# Patient Record
Sex: Male | Born: 1995
Health system: Southern US, Community
[De-identification: ages and names within clinical notes are randomized; demographics above are authoritative.]

## PROBLEM LIST (undated history)

## (undated) DIAGNOSIS — J3089 Other allergic rhinitis: Secondary | ICD-10-CM

## (undated) DIAGNOSIS — L309 Dermatitis, unspecified: Secondary | ICD-10-CM

## (undated) DIAGNOSIS — S62309A Unspecified fracture of unspecified metacarpal bone, initial encounter for closed fracture: Secondary | ICD-10-CM

## (undated) DIAGNOSIS — Z973 Presence of spectacles and contact lenses: Secondary | ICD-10-CM

## (undated) HISTORY — PX: NO PAST SURGERIES: SHX2092

---
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
## 2015-01-21 ENCOUNTER — Encounter (HOSPITAL_BASED_OUTPATIENT_CLINIC_OR_DEPARTMENT_OTHER): Payer: Self-pay | Admitting: *Deleted

## 2015-01-21 ENCOUNTER — Emergency Department (HOSPITAL_BASED_OUTPATIENT_CLINIC_OR_DEPARTMENT_OTHER)
Admission: EM | Admit: 2015-01-21 | Discharge: 2015-01-21 | Disposition: A | Discharge status: Home / Self Care | Payer: Medicaid Other | Attending: Emergency Medicine | Admitting: Emergency Medicine

## 2015-01-21 ENCOUNTER — Emergency Department (HOSPITAL_BASED_OUTPATIENT_CLINIC_OR_DEPARTMENT_OTHER): Payer: Medicaid Other

## 2015-01-21 DIAGNOSIS — Y998 Other external cause status: Secondary | ICD-10-CM | POA: Insufficient documentation

## 2015-01-21 DIAGNOSIS — Z7951 Long term (current) use of inhaled steroids: Secondary | ICD-10-CM | POA: Insufficient documentation

## 2015-01-21 DIAGNOSIS — Y9289 Other specified places as the place of occurrence of the external cause: Secondary | ICD-10-CM | POA: Diagnosis not present

## 2015-01-21 DIAGNOSIS — J45909 Unspecified asthma, uncomplicated: Secondary | ICD-10-CM | POA: Diagnosis not present

## 2015-01-21 DIAGNOSIS — S62306A Unspecified fracture of fifth metacarpal bone, right hand, initial encounter for closed fracture: Secondary | ICD-10-CM

## 2015-01-21 DIAGNOSIS — S62314A Displaced fracture of base of fourth metacarpal bone, right hand, initial encounter for closed fracture: Secondary | ICD-10-CM | POA: Insufficient documentation

## 2015-01-21 DIAGNOSIS — Y9389 Activity, other specified: Secondary | ICD-10-CM | POA: Diagnosis not present

## 2015-01-21 DIAGNOSIS — H539 Unspecified visual disturbance: Secondary | ICD-10-CM | POA: Diagnosis not present

## 2015-01-21 DIAGNOSIS — W228XXA Striking against or struck by other objects, initial encounter: Secondary | ICD-10-CM | POA: Insufficient documentation

## 2015-01-21 DIAGNOSIS — Z8669 Personal history of other diseases of the nervous system and sense organs: Secondary | ICD-10-CM | POA: Insufficient documentation

## 2015-01-21 DIAGNOSIS — S62308A Unspecified fracture of other metacarpal bone, initial encounter for closed fracture: Secondary | ICD-10-CM

## 2015-01-21 DIAGNOSIS — S62336A Displaced fracture of neck of fifth metacarpal bone, right hand, initial encounter for closed fracture: Secondary | ICD-10-CM | POA: Insufficient documentation

## 2015-01-21 DIAGNOSIS — S6991XA Unspecified injury of right wrist, hand and finger(s), initial encounter: Secondary | ICD-10-CM | POA: Diagnosis present

## 2015-01-21 MED ORDER — HYDROCODONE-ACETAMINOPHEN 5-325 MG PO TABS: 2.0000 | ORAL_TABLET | ORAL | Status: DC | PRN

## 2015-01-21 NOTE — ED Provider Notes (Signed)
CSN: [id number]     Arrival date & time 01/21/15  1405 History   First MD Initiated Contact with Patient 01/21/15 1412     Chief Complaint  Patient presents with  . Hand Injury     (Consider location/radiation/quality/duration/timing/severity/associated sxs/prior Treatment) HPI Comments: Presents to the ER for evaluation of right hand injury. Patient reportedly became angry and punched a wall 2 hours before arrival. Patient complaints moderate to severe pain and swelling of the right hand. Pain worsens with movement. No wrist injury.  Patient is a 19 y.o. male presenting with hand injury.  Hand Injury   Past Medical History  Diagnosis Date  . Allergy   . Asthma   . Vision abnormalities    History reviewed. No pertinent past surgical history. Family History  Problem Relation Age of Onset  . Cancer Brother   . Hypertension Brother   . Hypertension Father   . Hypertension Maternal Aunt   . Hypertension Maternal Grandmother   . Hypertension Maternal Grandfather   . Hypertension Paternal Grandmother   . Hypertension Paternal Grandfather    History  Substance Use Topics  . Smoking status: Never Smoker   . Smokeless tobacco: Never Used  . Alcohol Use: No    Review of Systems  Musculoskeletal: Positive for arthralgias.  Skin: Negative.   Neurological: Negative.       Allergies  Review of patient's allergies indicates no known allergies.  Home Medications   Prior to Admission medications   Medication Sig Start Date End Date Taking? Authorizing Provider  cetirizine (ZYRTEC) 10 MG tablet Take 10 mg by mouth daily as needed.    Historical Provider, MD  fluticasone (FLONASE) 50 MCG/ACT nasal spray Place 2 sprays into the nose daily. 06/24/11 06/23/12  Lucio EdwardShilpa Gosrani, MD  mometasone (ELOCON) 0.1 % cream Apply sparingly twice a day to eczema rash for a week 11/13/12   Faylene Kurtzeborah Leiner, MD   BP 118/54 mmHg  Pulse 64  Temp(Src) 98.7 F (37.1 C) (Oral)  Resp 16  Ht 5\' 9"   (1.753 m)  Wt 125 lb (56.7 kg)  BMI 18.45 kg/m2  SpO2 100% Physical Exam  Musculoskeletal:       Right wrist: Normal.       Right hand: He exhibits decreased range of motion, tenderness, bony tenderness (fifth metacarpal) and deformity. He exhibits normal capillary refill. Normal sensation noted. Normal strength noted.  Neurological: He is alert. He has normal strength. No cranial nerve deficit or sensory deficit. GCS eye subscore is 4. GCS verbal subscore is 5. GCS motor subscore is 6.  Skin: Skin is warm, dry and intact. No abrasion, no bruising and no laceration noted.    ED Course  Procedures (including critical care time) Labs Review Labs Reviewed - No data to display  Imaging Review Dg Hand Complete Right  01/21/2015   CLINICAL DATA:  The patient punched a wall. Scratch the patient punched a wall today. Right hand pain. Initial encounter.  EXAM: RIGHT HAND - COMPLETE 3+ VIEW  COMPARISON:  Plain films right hand 05/01/2013.  FINDINGS: The patient has an acute fracture of the neck of the fifth metacarpal with volar and medial angulation. There is also a fracture of the base of the fourth metacarpal which appears nondisplaced. No other acute bony or joint abnormality is identified. Soft tissue swelling in association with the patient's fractures is noted.  IMPRESSION: Acute fractures of the neck of the fifth metacarpal and base of the fourth metacarpal as described.  Electronically Signed   By: Drusilla Kannerhomas  Dalessio M.D.   On: 01/21/2015 14:31     EKG Interpretation None      MDM   Final diagnoses:  None   fracture fourth and fifth metacarpal  Resents to the ER for evaluation of hand injury. Patient punched a wall prior to arrival. Patient has swelling and tenderness of the lateral aspect of the hand. X-ray confirms fracture of fourth and fifth metacarpal. Patient placed in an ulnar gutter splint, follow-up with hand surgery.    Gilda Creasehristopher J Aerilynn Goin, MD 01/21/15 1505

## 2015-01-21 NOTE — ED Notes (Signed)
Pt reports punching a wall x 2 hrs ago

## 2015-01-21 NOTE — Discharge Instructions (Signed)
Boxer's Fracture °You have a break (fracture) of the fifth metacarpal bone. This is commonly called a boxer's fracture. This is the bone in the hand where the little finger attaches. The fracture is in the end of that bone, closest to the little finger. It is usually caused when you hit an object with a clenched fist. Often, the knuckle is pushed down by the impact. Sometimes, the fracture rotates out of position. A boxer's fracture will usually heal within 6 weeks, if it is treated properly and protected from re-injury. Surgery is sometimes needed. °A cast, splint, or bulky hand dressing may be used to protect and immobilize a boxer's fracture. Do not remove this device or dressing until your caregiver approves. Keep your hand elevated, and apply ice packs for 15-20 minutes every 2 hours, for the first 2 days. Elevation and ice help reduce swelling and relieve pain. See your caregiver, or an orthopedic specialist, for follow-up care within the next 10 days. This is to make sure your fracture is healing properly. °Document Released: 09/05/2005 Document Revised: 11/28/2011 Document Reviewed: 02/23/2007 °ExitCare® Patient Information ©2015 [employer]. This information is not intended to replace advice given to you by your health care provider. Make sure you discuss any questions you have with your health care provider. ° °

## 2015-01-27 ENCOUNTER — Encounter (HOSPITAL_BASED_OUTPATIENT_CLINIC_OR_DEPARTMENT_OTHER): Payer: Self-pay | Admitting: *Deleted

## 2015-01-27 NOTE — Progress Notes (Signed)
NPO AFTER MN WITH EXCEPTION CLEAR LIQUIDS UNTIL 0900.  ARRIVE AT 1345.  NEEDS HG.  MAY TAKE HYDROCODONE IF NEEDED AM DOS.

## 2015-01-29 ENCOUNTER — Ambulatory Visit (HOSPITAL_BASED_OUTPATIENT_CLINIC_OR_DEPARTMENT_OTHER): Payer: Medicaid Other | Admitting: Anesthesiology

## 2015-01-29 ENCOUNTER — Encounter (HOSPITAL_BASED_OUTPATIENT_CLINIC_OR_DEPARTMENT_OTHER): Payer: Self-pay | Admitting: *Deleted

## 2015-01-29 ENCOUNTER — Ambulatory Visit (HOSPITAL_BASED_OUTPATIENT_CLINIC_OR_DEPARTMENT_OTHER)
Admission: RE | Admit: 2015-01-29 | Discharge: 2015-01-29 | Disposition: A | Discharge status: Home / Self Care | Payer: Medicaid Other | Source: Ambulatory Visit | Attending: Orthopedic Surgery | Admitting: Orthopedic Surgery

## 2015-01-29 ENCOUNTER — Encounter (HOSPITAL_BASED_OUTPATIENT_CLINIC_OR_DEPARTMENT_OTHER)
Admission: RE | Disposition: A | Discharge status: Home / Self Care | Payer: Self-pay | Source: Ambulatory Visit | Attending: Orthopedic Surgery

## 2015-01-29 DIAGNOSIS — Z7952 Long term (current) use of systemic steroids: Secondary | ICD-10-CM | POA: Insufficient documentation

## 2015-01-29 DIAGNOSIS — S62324A Displaced fracture of shaft of fourth metacarpal bone, right hand, initial encounter for closed fracture: Secondary | ICD-10-CM | POA: Diagnosis present

## 2015-01-29 DIAGNOSIS — J45909 Unspecified asthma, uncomplicated: Secondary | ICD-10-CM | POA: Insufficient documentation

## 2015-01-29 DIAGNOSIS — Z7951 Long term (current) use of inhaled steroids: Secondary | ICD-10-CM | POA: Insufficient documentation

## 2015-01-29 DIAGNOSIS — S62336A Displaced fracture of neck of fifth metacarpal bone, right hand, initial encounter for closed fracture: Secondary | ICD-10-CM | POA: Insufficient documentation

## 2015-01-29 DIAGNOSIS — J302 Other seasonal allergic rhinitis: Secondary | ICD-10-CM | POA: Diagnosis not present

## 2015-01-29 DIAGNOSIS — L309 Dermatitis, unspecified: Secondary | ICD-10-CM | POA: Diagnosis not present

## 2015-01-29 DIAGNOSIS — W2209XA Striking against other stationary object, initial encounter: Secondary | ICD-10-CM | POA: Diagnosis not present

## 2015-01-29 HISTORY — DX: Unspecified fracture of unspecified metacarpal bone, initial encounter for closed fracture: S62.309A

## 2015-01-29 HISTORY — DX: Other allergic rhinitis: J30.89

## 2015-01-29 HISTORY — DX: Presence of spectacles and contact lenses: Z97.3

## 2015-01-29 HISTORY — DX: Dermatitis, unspecified: L30.9

## 2015-01-29 HISTORY — PX: CLOSED REDUCTION FINGER WITH PERCUTANEOUS PINNING: SHX5612

## 2015-01-29 SURGERY — CLOSED REDUCTION, FINGER, WITH PERCUTANEOUS PINNING
Anesthesia: General | Site: Finger | Laterality: Right

## 2015-01-29 MED ORDER — OXYCODONE HCL 5 MG/5ML PO SOLN
5.0000 mg | Freq: Once | ORAL | Status: DC | PRN
  Filled 2015-01-29: qty 5

## 2015-01-29 MED ORDER — LIDOCAINE HCL (CARDIAC) 20 MG/ML IV SOLN
INTRAVENOUS | Status: DC | PRN
  Administered 2015-01-29: 80 mg via INTRAVENOUS

## 2015-01-29 MED ORDER — LACTATED RINGERS IV SOLN
INTRAVENOUS | Status: DC
  Administered 2015-01-29: 15:00:00 via INTRAVENOUS
  Filled 2015-01-29: qty 1000

## 2015-01-29 MED ORDER — ONDANSETRON HCL 4 MG/2ML IJ SOLN
4.0000 mg | Freq: Four times a day (QID) | INTRAMUSCULAR | Status: DC | PRN
  Filled 2015-01-29: qty 2

## 2015-01-29 MED ORDER — HYDROCODONE-ACETAMINOPHEN 5-325 MG PO TABS
ORAL_TABLET | ORAL | Status: AC
  Filled 2015-01-29: qty 1

## 2015-01-29 MED ORDER — HYDROMORPHONE HCL 1 MG/ML IJ SOLN
INTRAMUSCULAR | Status: AC
  Filled 2015-01-29: qty 1

## 2015-01-29 MED ORDER — MIDAZOLAM HCL 2 MG/2ML IJ SOLN
INTRAMUSCULAR | Status: AC
  Filled 2015-01-29: qty 2

## 2015-01-29 MED ORDER — CEFAZOLIN SODIUM-DEXTROSE 2-3 GM-% IV SOLR
INTRAVENOUS | Status: AC
  Filled 2015-01-29: qty 50

## 2015-01-29 MED ORDER — HYDROCODONE-ACETAMINOPHEN 5-325 MG PO TABS
1.0000 | ORAL_TABLET | Freq: Four times a day (QID) | ORAL | Status: DC | PRN
  Administered 2015-01-29: 1 via ORAL
  Filled 2015-01-29: qty 1

## 2015-01-29 MED ORDER — BUPIVACAINE HCL (PF) 0.25 % IJ SOLN
INTRAMUSCULAR | Status: DC | PRN
  Administered 2015-01-29: 8 mL

## 2015-01-29 MED ORDER — CHLORHEXIDINE GLUCONATE 4 % EX LIQD
60.0000 mL | Freq: Once | CUTANEOUS | Status: AC
  Administered 2015-01-29: 4 via TOPICAL
  Filled 2015-01-29: qty 60

## 2015-01-29 MED ORDER — OXYCODONE HCL 5 MG PO TABS
5.0000 mg | ORAL_TABLET | Freq: Once | ORAL | Status: DC | PRN
  Filled 2015-01-29: qty 1

## 2015-01-29 MED ORDER — HYDROCODONE-ACETAMINOPHEN 5-325 MG PO TABS: 30.0000 | ORAL_TABLET | ORAL | Status: DC | PRN

## 2015-01-29 MED ORDER — FENTANYL CITRATE (PF) 100 MCG/2ML IJ SOLN
INTRAMUSCULAR | Status: DC | PRN
  Administered 2015-01-29: 100 ug via INTRAVENOUS

## 2015-01-29 MED ORDER — ONDANSETRON HCL 4 MG/2ML IJ SOLN
INTRAMUSCULAR | Status: DC | PRN
  Administered 2015-01-29: 4 mg via INTRAVENOUS

## 2015-01-29 MED ORDER — CEFAZOLIN SODIUM-DEXTROSE 2-3 GM-% IV SOLR
2.0000 g | INTRAVENOUS | Status: AC
  Administered 2015-01-29: 2 g via INTRAVENOUS
  Filled 2015-01-29: qty 50

## 2015-01-29 MED ORDER — PROPOFOL 10 MG/ML IV BOLUS
INTRAVENOUS | Status: DC | PRN
  Administered 2015-01-29: 200 mg via INTRAVENOUS

## 2015-01-29 MED ORDER — MIDAZOLAM HCL 5 MG/5ML IJ SOLN
INTRAMUSCULAR | Status: DC | PRN
  Administered 2015-01-29: 2 mg via INTRAVENOUS

## 2015-01-29 MED ORDER — HYDROMORPHONE HCL 1 MG/ML IJ SOLN
0.2500 mg | INTRAMUSCULAR | Status: DC | PRN
  Administered 2015-01-29 (×3): 0.25 mg via INTRAVENOUS
  Filled 2015-01-29: qty 1

## 2015-01-29 MED ORDER — FENTANYL CITRATE (PF) 100 MCG/2ML IJ SOLN
INTRAMUSCULAR | Status: AC
  Filled 2015-01-29: qty 4

## 2015-01-29 MED ORDER — DEXAMETHASONE SODIUM PHOSPHATE 4 MG/ML IJ SOLN
INTRAMUSCULAR | Status: DC | PRN
  Administered 2015-01-29: 10 mg via INTRAVENOUS

## 2015-01-29 SURGICAL SUPPLY — 58 items
BANDAGE CONFORM 3  STR LF (GAUZE/BANDAGES/DRESSINGS) IMPLANT
BANDAGE ELASTIC 3 VELCRO ST LF (GAUZE/BANDAGES/DRESSINGS) ×4 IMPLANT
BANDAGE ELASTIC 4 VELCRO ST LF (GAUZE/BANDAGES/DRESSINGS) IMPLANT
BANDAGE GAUZE ELAST BULKY 4 IN (GAUZE/BANDAGES/DRESSINGS) IMPLANT
BENZOIN TINCTURE PRP APPL 2/3 (GAUZE/BANDAGES/DRESSINGS) IMPLANT
BLADE SURG 15 STRL LF DISP TIS (BLADE) ×1 IMPLANT
BLADE SURG 15 STRL SS (BLADE) ×1
BNDG COHESIVE 3X5 TAN STRL LF (GAUZE/BANDAGES/DRESSINGS) ×4 IMPLANT
BNDG ESMARK 4X9 LF (GAUZE/BANDAGES/DRESSINGS) IMPLANT
BNDG GAUZE ELAST 4 BULKY (GAUZE/BANDAGES/DRESSINGS) ×2 IMPLANT
CLOTH BEACON ORANGE TIMEOUT ST (SAFETY) ×2 IMPLANT
CORDS BIPOLAR (ELECTRODE) IMPLANT
COVER BACK TABLE 60X90IN (DRAPES) ×2 IMPLANT
CUFF TOURNIQUET SINGLE 18IN (TOURNIQUET CUFF) ×2 IMPLANT
CUFF TOURNIQUET SINGLE 24IN (TOURNIQUET CUFF) IMPLANT
DRAIN PENROSE 18X1/4 LTX STRL (WOUND CARE) IMPLANT
DRAPE EXTREMITY T 121X128X90 (DRAPE) ×2 IMPLANT
DRAPE OEC MINIVIEW 54X84 (DRAPES) IMPLANT
DRAPE SURG 17X23 STRL (DRAPES) IMPLANT
DRSG EMULSION OIL 3X3 NADH (GAUZE/BANDAGES/DRESSINGS) IMPLANT
ELECT NEEDLE TIP 2.8 STRL (NEEDLE) IMPLANT
ELECT REM PT RETURN 9FT ADLT (ELECTROSURGICAL) ×2
ELECTRODE REM PT RTRN 9FT ADLT (ELECTROSURGICAL) ×1 IMPLANT
GAUZE SPONGE 4X4 16PLY XRAY LF (GAUZE/BANDAGES/DRESSINGS) IMPLANT
GAUZE XEROFORM 1X8 LF (GAUZE/BANDAGES/DRESSINGS) ×2 IMPLANT
GLOVE BIO SURGEON STRL SZ8 (GLOVE) ×2 IMPLANT
GOWN STRL REUS W/TWL LRG LVL3 (GOWN DISPOSABLE) ×2 IMPLANT
GOWN STRL REUS W/TWL XL LVL3 (GOWN DISPOSABLE) ×2 IMPLANT
K-WIRE .035X4 (WIRE) IMPLANT
K-WIRE .045X4 (WIRE) ×8 IMPLANT
LOOP VESSEL MAXI BLUE (MISCELLANEOUS) IMPLANT
NEEDLE HYPO 25X1 1.5 SAFETY (NEEDLE) ×2 IMPLANT
NS IRRIG 500ML POUR BTL (IV SOLUTION) ×2 IMPLANT
PACK BASIN DAY SURGERY FS (CUSTOM PROCEDURE TRAY) ×2 IMPLANT
PAD CAST 3X4 CTTN HI CHSV (CAST SUPPLIES) ×1 IMPLANT
PADDING CAST ABS 3INX4YD NS (CAST SUPPLIES) ×1
PADDING CAST ABS 4INX4YD NS (CAST SUPPLIES) ×1
PADDING CAST ABS COTTON 3X4 (CAST SUPPLIES) ×1 IMPLANT
PADDING CAST ABS COTTON 4X4 ST (CAST SUPPLIES) ×1 IMPLANT
PADDING CAST COTTON 3X4 STRL (CAST SUPPLIES) ×1
PENCIL BUTTON HOLSTER BLD 10FT (ELECTRODE) IMPLANT
SPLINT PLASTER CAST XFAST 3X15 (CAST SUPPLIES) ×1 IMPLANT
SPLINT PLASTER CAST XFAST 4X15 (CAST SUPPLIES) IMPLANT
SPLINT PLASTER XTRA FAST SET 4 (CAST SUPPLIES)
SPLINT PLASTER XTRA FASTSET 3X (CAST SUPPLIES) ×1
SPONGE GAUZE 4X4 12PLY (GAUZE/BANDAGES/DRESSINGS) ×2 IMPLANT
SPONGE GAUZE 4X4 12PLY STER LF (GAUZE/BANDAGES/DRESSINGS) ×2 IMPLANT
STOCKINETTE 4X48 STRL (DRAPES) ×2 IMPLANT
STRIP CLOSURE SKIN 1/2X4 (GAUZE/BANDAGES/DRESSINGS) IMPLANT
SUT ETHILON 4 0 P 3 18 (SUTURE) IMPLANT
SUT ETHILON 5 0 P 3 18 (SUTURE)
SUT NYLON ETHILON 5-0 P-3 1X18 (SUTURE) IMPLANT
SYR BULB 3OZ (MISCELLANEOUS) ×2 IMPLANT
SYR CONTROL 10ML LL (SYRINGE) ×2 IMPLANT
TOWEL OR 17X24 6PK STRL BLUE (TOWEL DISPOSABLE) ×4 IMPLANT
UNDERPAD 30X30 INCONTINENT (UNDERPADS AND DIAPERS) ×2 IMPLANT
WATER STERILE IRR 500ML POUR (IV SOLUTION) ×2 IMPLANT
k-wires ×8 IMPLANT

## 2015-01-29 NOTE — Anesthesia Procedure Notes (Signed)
Procedure Name: LMA Insertion Date/Time: 01/29/2015 3:58 PM Performed by: Tyrone NineSAUVE, Laureen Frederic F Pre-anesthesia Checklist: Patient identified, Timeout performed, Emergency Drugs available, Suction available and Patient being monitored Patient Re-evaluated:Patient Re-evaluated prior to inductionOxygen Delivery Method: Circle system utilized Preoxygenation: Pre-oxygenation with 100% oxygen Intubation Type: IV induction Ventilation: Mask ventilation without difficulty LMA: LMA inserted LMA Size: 4.0 Number of attempts: 1 Airway Equipment and Method: Bite block Placement Confirmation: breath sounds checked- equal and bilateral and positive ETCO2 Tube secured with: Tape Dental Injury: Teeth and Oropharynx as per pre-operative assessment

## 2015-01-29 NOTE — H&P (Signed)
Shawn Dillon is an 19 y.o. male.   Chief Complaint: Right hand injury small and ring fingers HPI: Pt hit stationary object Closed injury to right hand Pt here for surgery for multiple metacarpal fractures No prior surgery to right hand  Past Medical History  Diagnosis Date  . Eczema   . Fracture, metacarpal     RIGHT 4TH AND 5TH FINGER   . Environmental and seasonal allergies   . Wears contact lenses     Past Surgical History  Procedure Laterality Date  . No past surgeries      Family History  Problem Relation Age of Onset  . Cancer Brother   . Hypertension Brother   . Hypertension Father   . Hypertension Maternal Aunt   . Hypertension Maternal Grandmother   . Hypertension Maternal Grandfather   . Hypertension Paternal Grandmother   . Hypertension Paternal Grandfather    Social History:  reports that he has never smoked. He has never used smokeless tobacco. He reports that he does not drink alcohol or use illicit drugs.  Allergies: No Known Allergies  Medications Prior to Admission  Medication Sig Dispense Refill  . cetirizine (ZYRTEC) 10 MG tablet Take 10 mg by mouth daily as needed.    . fluticasone (FLONASE) 50 MCG/ACT nasal spray Place into both nostrils as needed for allergies or rhinitis.    Marland Kitchen. HYDROcodone-acetaminophen (NORCO/VICODIN) 5-325 MG per tablet Take 2 tablets by mouth every 4 (four) hours as needed for moderate pain. 20 tablet 0  . mometasone (ELOCON) 0.1 % cream Apply sparingly twice a day to eczema rash for a week 30 g 1    No results found for this or any previous visit (from the past 48 hour(s)). No results found.  ROSNO RECENT ILLNESSES OR HOSPITALIZATIONS  Blood pressure 130/51, pulse 58, temperature 97.8 F (36.6 C), temperature source Oral, resp. rate 12, weight 54.885 kg (121 lb), SpO2 100 %. Physical Exam  General Appearance:  Alert, cooperative, no distress, appears stated age  Head:  Normocephalic, without obvious abnormality, atraumatic   Eyes:  Pupils equal, conjunctiva/corneas clear,         Throat: Lips, mucosa, and tongue normal; teeth and gums normal  Neck: No visible masses     Lungs:   respirations unlabored  Chest Wall:  No tenderness or deformity  Heart:  Regular rate and rhythm,  Abdomen:   Soft, non-tender,         Extremities: RIGHT HAND SKIN INTACT WIGGLES FINGERS FINGERS WARM WELL PERFUSED GOOD MUSCLE TONE ABLE TO CROSS FINGERS  Pulses: 2+ and symmetric  Skin: Skin color, texture, turgor normal, no rashes or lesions     Neurologic: Normal    Assessment/Plan RIGHT SMALL AND RING FINGER METACARPAL FRACTURES  RIGHT HAND CLOSED REDUCTION AND PINNING POSSIBLE OPEN REDUCTION AND INTERNAL FIXATION  R/B/A DISCUSSED WITH PT IN OFFICE.  PT VOICED UNDERSTANDING OF PLAN CONSENT SIGNED DAY OF SURGERY PT SEEN AND EXAMINED PRIOR TO OPERATIVE PROCEDURE/DAY OF SURGERY SITE MARKED. QUESTIONS ANSWERED WILL GO HOME FOLLOWING SURGERY WE ARE PLANNING SURGERY FOR YOUR UPPER EXTREMITY. THE RISKS AND BENEFITS OF SURGERY INCLUDE BUT NOT LIMITED TO BLEEDING INFECTION, DAMAGE TO NEARBY NERVES ARTERIES TENDONS, FAILURE OF SURGERY TO ACCOMPLISH ITS INTENDED GOALS, PERSISTENT SYMPTOMS AND NEED FOR FURTHER SURGICAL INTERVENTION. WITH THIS IN MIND WE WILL PROCEED. I HAVE DISCUSSED WITH THE PATIENT THE PRE AND POSTOPERATIVE REGIMEN AND THE DOS AND DON'TS. PT VOICED UNDERSTANDING AND INFORMED CONSENT SIGNED.  Sharma CovertTMANN,Alizay Bronkema W 01/29/2015,  3:54 PM

## 2015-01-29 NOTE — Discharge Instructions (Signed)
KEEP BANDAGE CLEAN AND DRY °CALL OFFICE FOR F/U APPT [phone] IN 2 WEEKS °KEEP HAND ELEVATED ABOVE HEART °OK TO APPLY ICE TO OPERATIVE AREA °CONTACT OFFICE IF ANY WORSENING PAIN OR CONCERNS. ° ° °      HAND SURGERY ° °  HOME CARE INSTRUCTIONS ° ° ° °The following instructions have been prepared to help you care for yourself upon your return home today. ° °Wound Care:  °Keep your hand elevated above the level of your heart. Do not allow it to dangle by your side. Keep the dressing dry and do not remove it unless your doctor advises you to do so. He will usually change it at the time of you post-op visit. Moving your fingers is advised to stimulate circulation but will depend on the site of your surgery. Of course, if you have a splint applied your doctor will advise you about movement. ° °Activity:  °Do not drive or operate machinery today. Rest today and then you may return to your normal activity and work as indicated by your physician. ° °Diet: °Drink liquids today or eat a light diet. You may resume a regular diet tomorrow. ° °General expectations: °Pain for two or three days. °Fingers may become slightly swollen.  ° °Unexpected Observations- Call your doctor if any of these occur: °Severe pain not relieved by pain medication. °Elevated temperature. °Dressing soaked with blood. °Inability to move fingers. °White or bluish color to fingers. ° ° ° ° ° °Post Anesthesia Home Care Instructions ° °Activity: °Get plenty of rest for the remainder of the day. A responsible adult should stay with you for 24 hours following the procedure.  °For the next 24 hours, DO NOT: °-Drive a car °-Operate machinery °-Drink alcoholic beverages °-Take any medication unless instructed by your physician °-Make any legal decisions or sign important papers. ° °Meals: °Start with liquid foods such as gelatin or soup. Progress to regular foods as tolerated. Avoid greasy, spicy, heavy foods. If nausea and/or vomiting occur, drink only clear  liquids until the nausea and/or vomiting subsides. Call your physician if vomiting continues. ° °Special Instructions/Symptoms: °Your throat may feel dry or sore from the anesthesia or the breathing tube placed in your throat during surgery. If this causes discomfort, gargle with warm salt water. The discomfort should disappear within 24 hours. ° °If you had a scopolamine patch placed behind your ear for the management of post- operative nausea and/or vomiting: ° °1. The medication in the patch is effective for 72 hours, after which it should be removed.  Wrap patch in a tissue and discard in the trash. Wash hands thoroughly with soap and water. °2. You may remove the patch earlier than 72 hours if you experience unpleasant side effects which may include dry mouth, dizziness or visual disturbances. °3. Avoid touching the patch. Wash your hands with soap and water after contact with the patch. °  ° °

## 2015-01-29 NOTE — Anesthesia Preprocedure Evaluation (Addendum)
Anesthesia Evaluation  Patient identified by MRN, date of birth, ID band Patient awake    Reviewed: Allergy & Precautions, NPO status , Patient's Chart, lab work & pertinent test results  Airway Mallampati: I  TM Distance: >3 FB Neck ROM: full    Dental  (+) Teeth Intact, Dental Advisory Given   Pulmonary asthma ,  breath sounds clear to auscultation        Cardiovascular Exercise Tolerance: Good negative cardio ROS  Rhythm:regular Rate:Normal     Neuro/Psych negative neurological ROS     GI/Hepatic negative GI ROS, Neg liver ROS,   Endo/Other  negative endocrine ROS  Renal/GU negative Renal ROS  negative genitourinary   Musculoskeletal negative musculoskeletal ROS (+)   Abdominal   Peds  Hematology negative hematology ROS (+)   Anesthesia Other Findings   Reproductive/Obstetrics negative OB ROS                            Anesthesia Physical Anesthesia Plan  ASA: II  Anesthesia Plan: General   Post-op Pain Management:    Induction: Intravenous  Airway Management Planned: LMA  Additional Equipment:   Intra-op Plan:   Post-operative Plan:   Informed Consent: I have reviewed the patients History and Physical, chart, labs and discussed the procedure including the risks, benefits and alternatives for the proposed anesthesia with the patient or authorized representative who has indicated his/her understanding and acceptance.     Plan Discussed with: CRNA, Anesthesiologist and Surgeon  Anesthesia Plan Comments:         Anesthesia Quick Evaluation

## 2015-01-29 NOTE — Brief Op Note (Signed)
01/29/2015  3:56 PM  PATIENT:  Shawn Dillon  19 y.o. male  PRE-OPERATIVE DIAGNOSIS:  RIGHT SMALL AND RING FINGER METACARPAL FRACTURE   POST-OPERATIVE DIAGNOSIS:  RIGHT SMALL AND RING FINGER METACARPAL FRACTURE   PROCEDURE:  Procedure(s): RIGHT SMALL FINGER AND RIGHT RING FINGER CLOSED REDUCTION PINNING (Right) AND POSSIBLE OPEN REDUCTION INTERNAL FIXATION (ORIF)  (Right)  SURGEON:  Surgeon(s) and Role:    * Bradly BienenstockFred Deedra Pro, MD - Primary  PHYSICIAN ASSISTANT:   ASSISTANTS: none   ANESTHESIA:   general  EBL:     BLOOD ADMINISTERED:none  DRAINS: none   LOCAL MEDICATIONS USED:  MARCAINE     SPECIMEN:  No Specimen  DISPOSITION OF SPECIMEN:  N/A  COUNTS:  YES  TOURNIQUET:  * No tourniquets in log *  DICTATION: .Other Dictation: Dictation Number [id number]  PLAN OF CARE: Discharge to home after PACU  PATIENT DISPOSITION:  PACU - hemodynamically stable.   Delay start of Pharmacological VTE agent (>24hrs) due to surgical blood loss or risk of bleeding: yes

## 2015-01-29 NOTE — Anesthesia Postprocedure Evaluation (Signed)
Anesthesia Post Note  Patient: Shawn Dillon  Procedure(s) Performed: Procedure(s) (LRB): RIGHT SMALL FINGER AND RIGHT RING FINGER CLOSED REDUCTION PINNING (Right)  Anesthesia type: General  Patient location: PACU  Post pain: Pain level controlled and Adequate analgesia  Post assessment: Post-op Vital signs reviewed, Patient's Cardiovascular Status Stable, Respiratory Function Stable, Patent Airway and Pain level controlled  Last Vitals:  Filed Vitals:   01/29/15 1730  BP: 127/83  Pulse: 64  Temp:   Resp: 18    Post vital signs: Reviewed and stable  Level of consciousness: awake, alert  and oriented  Complications: No apparent anesthesia complications

## 2015-01-29 NOTE — Transfer of Care (Signed)
Immediate Anesthesia Transfer of Care Note  Patient: Shawn Dillon  Procedure(s) Performed: Procedure(s): RIGHT SMALL FINGER AND RIGHT RING FINGER CLOSED REDUCTION PINNING (Right) AND POSSIBLE OPEN REDUCTION INTERNAL FIXATION (ORIF)  (Right)  Patient Location: PACU  Anesthesia Type:General  Level of Consciousness: awake, alert , oriented and patient cooperative  Airway & Oxygen Therapy: Patient Spontanous Breathing and Patient connected to nasal cannula oxygen  Post-op Assessment: Report given to RN and Post -op Vital signs reviewed and stable  Post vital signs: Reviewed and stable  Last Vitals:  Filed Vitals:   01/29/15 1414  BP: 130/51  Pulse: 58  Temp: 36.6 C  Resp: 12    Complications: No apparent anesthesia complications

## 2015-01-30 ENCOUNTER — Encounter (HOSPITAL_BASED_OUTPATIENT_CLINIC_OR_DEPARTMENT_OTHER): Payer: Self-pay | Admitting: Orthopedic Surgery

## 2015-01-30 ENCOUNTER — Emergency Department (HOSPITAL_BASED_OUTPATIENT_CLINIC_OR_DEPARTMENT_OTHER)
Admission: EM | Admit: 2015-01-30 | Discharge: 2015-01-30 | Disposition: A | Discharge status: Home / Self Care | Payer: Medicaid Other | Attending: Emergency Medicine | Admitting: Emergency Medicine

## 2015-01-30 DIAGNOSIS — Z9889 Other specified postprocedural states: Secondary | ICD-10-CM

## 2015-01-30 DIAGNOSIS — Z872 Personal history of diseases of the skin and subcutaneous tissue: Secondary | ICD-10-CM | POA: Insufficient documentation

## 2015-01-30 DIAGNOSIS — R200 Anesthesia of skin: Secondary | ICD-10-CM | POA: Diagnosis present

## 2015-01-30 DIAGNOSIS — Z973 Presence of spectacles and contact lenses: Secondary | ICD-10-CM | POA: Insufficient documentation

## 2015-01-30 DIAGNOSIS — M791 Myalgia: Secondary | ICD-10-CM | POA: Insufficient documentation

## 2015-01-30 DIAGNOSIS — Z8781 Personal history of (healed) traumatic fracture: Secondary | ICD-10-CM | POA: Insufficient documentation

## 2015-01-30 DIAGNOSIS — G8918 Other acute postprocedural pain: Secondary | ICD-10-CM | POA: Diagnosis not present

## 2015-01-30 LAB — POCT HEMOGLOBIN-HEMACUE: Hemoglobin: 15.7 g/dL (ref 13.0–17.0)

## 2015-01-30 NOTE — Op Note (Signed)
[name]:  Mulvaney, Zyden                   ACCOUNT NO.:  [id number]  MEDICAL RECORD NO.:  [id number]  LOCATION:                               [facility]  PHYSICIAN:  Sharma CovertFred W. Magaline Steinberg IV, M.D.DATE OF BIRTH:  [birth year]  DATE OF PROCEDURE:  01/29/2015 DATE OF DISCHARGE:  01/29/2015                              OPERATIVE REPORT   PREOPERATIVE DIAGNOSES: 1. Right small finger metacarpal neck fracture. 2. Right ring finger metacarpal shaft fracture.  POSTOPERATIVE DIAGNOSES: 1. Right small finger metacarpal neck fracture. 2. Right ring finger metacarpal shaft fracture.  ATTENDING PHYSICIAN:  Sharma CovertFred W. Maika Kaczmarek, M.D. who scrubbed and present for the entire procedure.  ASSISTANT SURGEON:  None.  ANESTHESIA:  General via LMA.  SURGICAL PROCEDURE: 1. Percutaneous skeletal fixation, unstable metacarpal neck fracture,     right small finger. 2. Percutaneous skeletal fixation of right ring finger metacarpal     shaft. 3. Radiographs 3 views, right hand.  SURGICAL INDICATIONS:  Mr. Shawn Dillon is a right-hand-dominant gentleman, who hit a stationary object sustaining a closed injury to his right hand. The patient was seen and evaluated in the office and recommended to undergo the above procedure.  Risks, benefits, and alternatives were discussed in detail with the patient's family and the patient and signed informed consent was obtained.  Risks include, but not limited to bleeding, infection, damage to nearby nerves, arteries, tendons; loss of motion of the wrist and digits, incomplete relief of symptoms, need for further surgical intervention.  DESCRIPTION OF PROCEDURE:  The patient was properly identified the preoperative holding area and a mark with a permanent marker made on the right hand to indicate the correct operative site.  The patient was then brought back to the operating room, placed supine on the anesthesia room table where general anesthesia was administered.  The patient  tolerated this well.  A well-padded tourniquet was placed in the right brachium, sealed with 1000 drape.  Right upper extremity was then prepped and draped in normal sterile fashion.  Time-out was called.  The correct site was identified, and the procedure then begun.  Attention then turned to the right hand.  After adequate anesthesia and preoperative antibiotics, closed manipulation was then performed.  The patient tolerated this well.  This reduced the ring and the small fingers nicely.  Following this, two 0.045 K-wires were then placed in the metacarpal recess distally and across the fracture site proximally. Following this, 2 more additional K-wires were then placed from the small finger to the ring finger, stabilized the ring finger metacarpal. K-wires were then cut and bent, left out of the skin.  Xeroform bolster was then applied around the pin sites.  Sterile compressive bandage was then applied.  8 mL of 0.25% Marcaine intermetacarpal block was then performed.  The patient tolerated this well.  The patient was placed in a well-padded long-arm volar splint, extubated, and taken to the recovery room in good condition.  INTRAOPERATIVE RADIOGRAPHS:  Three views of the hand, AP, lateral, and oblique films of the hand did show the internal fixation in place. There was good position in both planes.  POSTPROCEDURAL PLAN:  The patient discharged  to home, seen back in the office in approximately 10 days for pin check, x-rays out of the splint, application of a short-arm cast, mobilizing the MP joint, pins in for a total 4 weeks, I will see him at the 4-week mark, x-rays pins out of the 4-week mark, and then begin an outpatient therapy regimen.  Radiographs at each visit.     Madelynn DoneFred W. Nivin Braniff IV, M.D.     FWO/MEDQ  D:  01/29/2015  T:  01/30/2015  Job:  [id number]

## 2015-01-30 NOTE — ED Notes (Signed)
Pt had right hand surgery yesterday for fx repair. Today he sts his hand is cold and numb. Cap refill is rapid but finger tips do feel cold.

## 2015-01-30 NOTE — ED Provider Notes (Signed)
CSN: [id number]     Arrival date & time 01/30/15  1853 History  This chart was scribed for Raeford RazorStephen Nell Schrack, MD by Octavia HeirArianna Nassar, ED Scribe. This patient was seen in room MH09/MH09 and the patient's care was started at 7:10 PM.    Chief Complaint  Patient presents with  . Numbness     The history is provided by the patient. No language interpreter was used.    HPI Comments: Shawn Dillon is a 19 y.o. male who presents to the Emergency Department with a broken hand complaining of gradual worsening numbness to all fingers in right hand that began today. Patient recently had hand surgery yesterday after he fractured his right hand approximately 9 days ago after he punched a wall. Since this time, he reports persistent numbness and called Dr. Thomas Hoffrttman and was informed to come to the ED. Patient reports pain to the right hand, however he states it is "tolerable". He denies any other symptoms at this time.  Patient sees his orthopedist in 2 weeks for a follow up.      Past Medical History  Diagnosis Date  . Eczema   . Fracture, metacarpal     RIGHT 4TH AND 5TH FINGER   . Environmental and seasonal allergies   . Wears contact lenses    Past Surgical History  Procedure Laterality Date  . No past surgeries    . Closed reduction finger with percutaneous pinning Right 01/29/2015    Procedure: RIGHT SMALL FINGER AND RIGHT RING FINGER CLOSED REDUCTION PINNING;  Surgeon: Bradly BienenstockFred Ortmann, MD;  [facility];  Service: Orthopedics;  Laterality: Right;   Family History  Problem Relation Age of Onset  . Cancer Brother   . Hypertension Brother   . Hypertension Father   . Hypertension Maternal Aunt   . Hypertension Maternal Grandmother   . Hypertension Maternal Grandfather   . Hypertension Paternal Grandmother   . Hypertension Paternal Grandfather    History  Substance Use Topics  . Smoking status: Never Smoker   . Smokeless tobacco: Never Used  . Alcohol Use: No     Review of Systems  Musculoskeletal: Positive for myalgias.  Neurological: Positive for numbness.  All other systems reviewed and are negative.     Allergies  Review of patient's allergies indicates no known allergies.  Home Medications   Prior to Admission medications   Medication Sig Start Date End Date Taking? Authorizing Provider  cetirizine (ZYRTEC) 10 MG tablet Take 10 mg by mouth daily as needed.    Historical Provider, MD  fluticasone (FLONASE) 50 MCG/ACT nasal spray Place into both nostrils as needed for allergies or rhinitis.    Historical Provider, MD  HYDROcodone-acetaminophen (NORCO/VICODIN) 5-325 MG per tablet Take 30 tablets by mouth every 4 (four) hours as needed for moderate pain. 01/29/15   Bradly BienenstockFred Ortmann, MD  mometasone (ELOCON) 0.1 % cream Apply sparingly twice a day to eczema rash for a week 11/13/12   Faylene Kurtzeborah Leiner, MD   BP 128/66 mmHg  Pulse 59  Temp(Src) 98.4 F (36.9 C)  Resp 16  SpO2 100% Physical Exam  Constitutional: He is oriented to person, place, and time. He appears well-developed and well-nourished. No distress.  HENT:  Head: Normocephalic and atraumatic.  Eyes: Right eye exhibits no discharge. Left eye exhibits no discharge.  Cardiovascular: Normal rate, regular rhythm and normal heart sounds.   Brisk cap refill   Pulmonary/Chest: Effort normal and breath sounds normal. No respiratory distress.  Musculoskeletal:  Normal range of motion.  Fingers appear well perfused   Neurological: He is alert and oriented to person, place, and time. Coordination normal.  Able to wiggle all fingers Sensation intact to all touch  Skin: Skin is warm and dry. No rash noted. He is not diaphoretic.  Psychiatric: He has a normal mood and affect. His behavior is normal.  Nursing note and vitals reviewed.   ED Course  Procedures   DIAGNOSTIC STUDIES: Oxygen Saturation is 100% on RA, normal by my interpretation.  COORDINATION OF CARE:  7:19 PM Patient  ordered to keep follow up appointment with PCP. Patient agrees.   Labs Review Labs Reviewed - No data to display  Imaging Review No results found.   EKG Interpretation None      MDM   Final diagnoses:  Post-operative state  Numbness of fingers    18yM with numbness in fingers post-op 4th/5th metacarpal pinning yesterday. Splint re-wrapped slightly looser and symptoms resolved. NVI. Return precautions discussed. Outpt FU with hand otherwise.    I personally preformed the services scribed in my presence. The recorded information has been reviewed is accurate. Raeford RazorStephen Kiylah Loyer, MD.     Raeford RazorStephen Shyrl Obi, MD 02/01/15 [phone]

## 2015-07-19 ENCOUNTER — Encounter (HOSPITAL_BASED_OUTPATIENT_CLINIC_OR_DEPARTMENT_OTHER): Payer: Self-pay | Admitting: Emergency Medicine

## 2015-07-19 ENCOUNTER — Emergency Department (HOSPITAL_BASED_OUTPATIENT_CLINIC_OR_DEPARTMENT_OTHER)
Admission: EM | Admit: 2015-07-19 | Discharge: 2015-07-19 | Disposition: A | Discharge status: Home / Self Care | Payer: Medicaid Other | Attending: Emergency Medicine | Admitting: Emergency Medicine

## 2015-07-19 DIAGNOSIS — Z872 Personal history of diseases of the skin and subcutaneous tissue: Secondary | ICD-10-CM | POA: Insufficient documentation

## 2015-07-19 DIAGNOSIS — Z8781 Personal history of (healed) traumatic fracture: Secondary | ICD-10-CM | POA: Insufficient documentation

## 2015-07-19 DIAGNOSIS — H109 Unspecified conjunctivitis: Secondary | ICD-10-CM | POA: Insufficient documentation

## 2015-07-19 MED ORDER — TETRACAINE HCL 0.5 % OP SOLN: 2.0000 [drp] | Freq: Once | OPHTHALMIC | Status: DC

## 2015-07-19 MED ORDER — PROPARACAINE HCL 0.5 % OP SOLN
OPHTHALMIC | Status: AC
  Filled 2015-07-19: qty 15

## 2015-07-19 MED ORDER — ERYTHROMYCIN 5 MG/GM OP OINT
TOPICAL_OINTMENT | Freq: Once | OPHTHALMIC | Status: AC
  Administered 2015-07-19: 1 via OPHTHALMIC
  Filled 2015-07-19: qty 3.5

## 2015-07-19 MED ORDER — FLUORESCEIN SODIUM 1 MG OP STRP
1.0000 | ORAL_STRIP | Freq: Once | OPHTHALMIC | Status: AC
  Administered 2015-07-19: 1 via OPHTHALMIC
  Filled 2015-07-19: qty 1

## 2015-07-19 MED ORDER — ERYTHROMYCIN 5 MG/GM OP OINT: TOPICAL_OINTMENT | Freq: Three times a day (TID) | OPHTHALMIC | Status: DC

## 2015-07-19 NOTE — ED Notes (Signed)
Pt in c/o R eye erythema and drainage. Sclera is red, and drainage is yellow. No other deformity noted.

## 2015-07-19 NOTE — ED Provider Notes (Signed)
CSN: [id number]     Arrival date & time 07/19/15  1641 History   First MD Initiated Contact with Patient 07/19/15 1647     Chief Complaint  Patient presents with  . Eye Drainage     (Consider location/radiation/quality/duration/timing/severity/associated sxs/prior Treatment) Patient is a 19 y.o. male presenting with eye pain.  Eye Pain This is a new problem. The problem occurs constantly. The problem has not changed since onset.Pertinent negatives include no chest pain and no shortness of breath. Nothing aggravates the symptoms. Nothing relieves the symptoms.    Past Medical History  Diagnosis Date  . Eczema   . Fracture, metacarpal     RIGHT 4TH AND 5TH FINGER   . Environmental and seasonal allergies   . Wears contact lenses    Past Surgical History  Procedure Laterality Date  . No past surgeries    . Closed reduction finger with percutaneous pinning Right 01/29/2015    Procedure: RIGHT SMALL FINGER AND RIGHT RING FINGER CLOSED REDUCTION PINNING;  Surgeon: Bradly BienenstockFred Ortmann, MD;  [facility];  Service: Orthopedics;  Laterality: Right;   Family History  Problem Relation Age of Onset  . Cancer Brother   . Hypertension Brother   . Hypertension Father   . Hypertension Maternal Aunt   . Hypertension Maternal Grandmother   . Hypertension Maternal Grandfather   . Hypertension Paternal Grandmother   . Hypertension Paternal Grandfather    Social History  Substance Use Topics  . Smoking status: Never Smoker   . Smokeless tobacco: Never Used  . Alcohol Use: No    Review of Systems  Eyes: Positive for pain, discharge and redness. Negative for photophobia and visual disturbance.  Respiratory: Negative for shortness of breath.   Cardiovascular: Negative for chest pain.  Endocrine: Negative for polydipsia and polyuria.  Genitourinary: Negative for dysuria.  All other systems reviewed and are negative.     Allergies  Review of patient's allergies  indicates no known allergies.  Home Medications   Prior to Admission medications   Medication Sig Start Date End Date Taking? Authorizing Provider  cetirizine (ZYRTEC) 10 MG tablet Take 10 mg by mouth daily as needed.    Historical Provider, MD  erythromycin ophthalmic ointment Place into the right eye 3 (three) times daily. For 7 days 07/19/15   Marily MemosJason Charday Capetillo, MD  fluticasone [facility](FLONASE) 50 MCG/ACT nasal spray Place into both nostrils as needed for allergies or rhinitis.    Historical Provider, MD  HYDROcodone-acetaminophen (NORCO/VICODIN) 5-325 MG per tablet Take 30 tablets by mouth every 4 (four) hours as needed for moderate pain. 01/29/15   Bradly BienenstockFred Ortmann, MD  mometasone (ELOCON) 0.1 % cream Apply sparingly twice a day to eczema rash for a week 11/13/12   Faylene Kurtzeborah Leiner, MD   BP 113/71 mmHg  Pulse 76  Temp(Src) 98.3 F (36.8 C) (Oral)  Resp 18  Ht 5\' 8"  (1.727 m)  Wt 130 lb (58.968 kg)  BMI 19.77 kg/m2  SpO2 100% Physical Exam  Constitutional: He appears well-developed and well-nourished.  HENT:  Head: Normocephalic and atraumatic.  Eyes: Right conjunctiva is injected. Left conjunctiva is not injected.  L EYE IOP: 19 R EYE IOP: 21 No foreign bodies or corneal abrasions.   Neck: Normal range of motion.  Cardiovascular: Normal rate.   Pulmonary/Chest: Effort normal. No respiratory distress.  Abdominal: He exhibits no distension.  Musculoskeletal: Normal range of motion.  Neurological: He is alert.  Nursing note and vitals reviewed.   ED Course  Procedures (including critical care time) Labs Review Labs Reviewed - No data to display  Imaging Review No results found. I have personally reviewed and evaluated these images and lab results as part of my medical decision-making.   EKG Interpretation None      MDM   Final diagnoses:  Conjunctivitis of right eye   Bacterial v viral conjunctivitis. ppx abx eyedrops given. Will fu w/ ophthalmologist if symptoms not improving.    I have personally and contemperaneously reviewed labs and imaging and used in my decision making as above.   A medical screening exam was performed and I feel the patient has had an appropriate workup for their chief complaint at this time and likelihood of emergent condition existing is low. They have been counseled on decision, discharge, follow up and which symptoms necessitate immediate return to the emergency department. They or their family verbally stated understanding and agreement with plan and discharged in stable condition.   I personally performed the services described in this documentation, which was scribed in my presence. The recorded information has been reviewed and is accurate.     Marily Memos, MD 07/20/15 [phone]

## 2015-07-19 NOTE — ED Notes (Signed)
DC teaching done with pt, stressed importance of utilizing eye medication as prescribed by EDP, also stressed importance of and rationale for handwashing before and after application of eye med. Pt instructed on how to apply eye medication, returned demonstration exactly per RN instructions. Opportunity for questions provided, Teach Back Method utilized for pt teaching

## 2015-07-19 NOTE — ED Notes (Signed)
Pt wore glasses during exam.

## 2015-07-19 NOTE — ED Notes (Signed)
Presents with 2 hx of rt eye swelling and drainage, sm amt of drainage currently observed at medial aspect of lower lid, yellow-white in color. Pt denies any visual disturbances, denies ear aches, HA or sore throat. Pt states that eye swelling is worse in early am.

## 2015-07-19 NOTE — ED Notes (Signed)
MD at bedside. 

## 2016-09-17 ENCOUNTER — Encounter (HOSPITAL_BASED_OUTPATIENT_CLINIC_OR_DEPARTMENT_OTHER): Payer: Self-pay | Admitting: Emergency Medicine

## 2016-09-17 ENCOUNTER — Emergency Department (HOSPITAL_BASED_OUTPATIENT_CLINIC_OR_DEPARTMENT_OTHER): Payer: Medicaid Other

## 2016-09-17 ENCOUNTER — Emergency Department (HOSPITAL_BASED_OUTPATIENT_CLINIC_OR_DEPARTMENT_OTHER)
Admission: EM | Admit: 2016-09-17 | Discharge: 2016-09-18 | Disposition: A | Discharge status: Home / Self Care | Payer: Medicaid Other | Attending: Emergency Medicine | Admitting: Emergency Medicine

## 2016-09-17 DIAGNOSIS — Z23 Encounter for immunization: Secondary | ICD-10-CM | POA: Insufficient documentation

## 2016-09-17 DIAGNOSIS — Y998 Other external cause status: Secondary | ICD-10-CM | POA: Insufficient documentation

## 2016-09-17 DIAGNOSIS — Y929 Unspecified place or not applicable: Secondary | ICD-10-CM | POA: Insufficient documentation

## 2016-09-17 DIAGNOSIS — W51XXXA Accidental striking against or bumped into by another person, initial encounter: Secondary | ICD-10-CM | POA: Insufficient documentation

## 2016-09-17 DIAGNOSIS — Y9367 Activity, basketball: Secondary | ICD-10-CM | POA: Insufficient documentation

## 2016-09-17 DIAGNOSIS — Z79899 Other long term (current) drug therapy: Secondary | ICD-10-CM | POA: Insufficient documentation

## 2016-09-17 DIAGNOSIS — F17200 Nicotine dependence, unspecified, uncomplicated: Secondary | ICD-10-CM | POA: Insufficient documentation

## 2016-09-17 DIAGNOSIS — J45909 Unspecified asthma, uncomplicated: Secondary | ICD-10-CM | POA: Insufficient documentation

## 2016-09-17 DIAGNOSIS — S0181XA Laceration without foreign body of other part of head, initial encounter: Secondary | ICD-10-CM

## 2016-09-17 DIAGNOSIS — S6991XA Unspecified injury of right wrist, hand and finger(s), initial encounter: Secondary | ICD-10-CM | POA: Insufficient documentation

## 2016-09-17 DIAGNOSIS — S01112A Laceration without foreign body of left eyelid and periocular area, initial encounter: Secondary | ICD-10-CM | POA: Insufficient documentation

## 2016-09-17 NOTE — ED Notes (Signed)
Here for L lower eyebrow lac, (denies: LOC, NV, visual changes, pain, HA, neck or back pain)

## 2016-09-17 NOTE — ED Notes (Signed)
EDPA into room 

## 2016-09-17 NOTE — ED Provider Notes (Signed)
[facility] Provider Note   CSN: [id number] Arrival date & time: 09/17/16  [id number] By signing my name below, I, Levon HedgerElizabeth Hall, attest that this documentation has been prepared under the direction and in the presence of non-physician practitioner, Ebbie Ridgehris D'Arcy Abraha, PA-C. Electronically Signed: Levon HedgerElizabeth Hall, Scribe. . 10:07 PM.   History   Chief Complaint Chief Complaint  Patient presents with  . Facial Laceration   HPI Shawn Dillon is a 20 y.o. male who presents to the Emergency Department complaining of laceration above left eye sustained today PTA. Pt states he was playing basketball and bumped someone in the head. No active bleeding during exam.  Pt also complains of sudden onset, worsening right thumb pain s/p mechanical injury "a while ago". Pt states his right thumb was kicked while playing football. He notes associated swelling. He has iced his thumb with no relief. Per pt's mother, his last tetanus was about 8 years ago. He denies LOC or any facial pain.  The history is provided by the patient. No language interpreter was used.   Past Medical History:  Diagnosis Date  . Eczema   . Environmental and seasonal allergies   . Fracture, metacarpal    RIGHT 4TH AND 5TH FINGER   . Wears contact lenses     Patient Active Problem List   Diagnosis Date Noted  . Well child visit 11/13/2012  . Seasonal allergies 06/24/2011  . Asthma 06/24/2011  . Eczema 06/24/2011    Past Surgical History:  Procedure Laterality Date  . CLOSED REDUCTION FINGER WITH PERCUTANEOUS PINNING Right 01/29/2015   Procedure: RIGHT SMALL FINGER AND RIGHT RING FINGER CLOSED REDUCTION PINNING;  Surgeon: Bradly BienenstockFred Ortmann, MD;  [facility] [place];  Service: Orthopedics;  Laterality: Right;  . NO PAST SURGERIES      Home Medications    Prior to Admission medications   Medication Sig Start Date End Date Taking? Authorizing Provider  cetirizine (ZYRTEC) 10 MG tablet Take 10 mg by mouth  daily as needed.    Historical Provider, MD  erythromycin ophthalmic ointment Place into the right eye 3 (three) times daily. For 7 days 07/19/15   Marily MemosJason Mesner, MD  fluticasone [facility](FLONASE) 50 MCG/ACT nasal spray Place into both nostrils as needed for allergies or rhinitis.    Historical Provider, MD  HYDROcodone-acetaminophen (NORCO/VICODIN) 5-325 MG per tablet Take 30 tablets by mouth every 4 (four) hours as needed for moderate pain. 01/29/15   Bradly BienenstockFred Ortmann, MD  mometasone (ELOCON) 0.1 % cream Apply sparingly twice a day to eczema rash for a week 11/13/12   Faylene Kurtzeborah Leiner, MD   Family History Family History  Problem Relation Age of Onset  . Cancer Brother   . Hypertension Brother   . Hypertension Father   . Hypertension Maternal Aunt   . Hypertension Maternal Grandmother   . Hypertension Maternal Grandfather   . Hypertension Paternal Grandmother   . Hypertension Paternal Grandfather    Social History Social History  Substance Use Topics  . Smoking status: Current Every Day Smoker  . Smokeless tobacco: Never Used  . Alcohol use No    Allergies   Patient has no known allergies.  Review of Systems Review of Systems 10 systems reviewed and all are negative for acute change except as noted in the HPI.  Physical Exam Updated Vital Signs BP 110/77 (BP Location: Left Arm)   Pulse 64   Temp 98.3 F (36.8 C) (Oral)   Resp 16   Ht 5\' 9"  (  1.753 m)   Wt 125 lb (56.7 kg)   SpO2 98%   BMI 18.46 kg/m   Physical Exam  Constitutional: He is oriented to person, place, and time. He appears well-developed and well-nourished. No distress.  HENT:  Head: Normocephalic and atraumatic.  Eyes: Conjunctivae are normal.  Cardiovascular: Normal rate.   Pulmonary/Chest: Effort normal.  Abdominal: He exhibits no distension.  Musculoskeletal: He exhibits tenderness.  Mild tenderness to base of right thumb. No swelling, no decreased ROM.   Neurological: He is alert and oriented to person, place,  and time.  Skin: Skin is warm and dry.  3 cm laceration just below left eyebrow  Psychiatric: He has a normal mood and affect.  Nursing note and vitals reviewed.   ED Treatments / Results  DIAGNOSTIC STUDIES:  Oxygen Saturation is 98% on RA, normal by my interpretation.    COORDINATION OF CARE:  10:02 PM Discussed treatment plan which includes glue with pt at bedside and pt agreed to plan.   Labs (all labs ordered are listed, but only abnormal results are displayed) Labs Reviewed - No data to display  EKG  EKG Interpretation None       Radiology No results found.  Procedures Procedures (including critical care time)  Medications Ordered in ED Medications - No data to display   Initial Impression / Assessment and Plan / ED Course  I have reviewed the triage vital signs and the nursing notes.  Pertinent labs & imaging results that were available during my care of the patient were reviewed by me and considered in my medical decision making (see chart for details).  Clinical Course    LACERATION REPAIR Performed by: Carlyle DollyLAWYER,Salaam Battershell W Authorized by: Carlyle DollyLAWYER,Kilee Hedding W Consent: Verbal consent obtained. Risks and benefits: risks, benefits and alternatives were discussed Consent given by: patient Patient identity confirmed: provided demographic data Prepped and Draped in normal sterile fashion Wound explored  Laceration Location: Left eyebrow region  Laceration Length: 3 cm  No Foreign Bodies seen or palpated  Anesthesia: local infiltration  Local anesthetic: None Anesthetic total: N/A ml  Irrigation method: syringe Amount of cleaning: standard  Skin closure: Dermabond   Number of sutures: Dermabond   Technique: Dermabond   Patient tolerance: Patient tolerated the procedure well with no immediate complications.  Final Clinical Impressions(s) / ED Diagnoses   Final diagnoses:  None   New Prescriptions New Prescriptions   No medications on  file  I personally performed the services described in this documentation, which was scribed in my presence. The recorded information has been reviewed and is accurate.   Charlestine NightChristopher Agusta Hackenberg, PA-C 09/18/16 0012    Loren Raceravid Yelverton, MD 09/18/16 [phone]

## 2016-09-17 NOTE — ED Triage Notes (Signed)
Patient reports playing basketball and bumping someone else in the head.  Laceration noted above left eye.  No active bleeding at present.  Denies LOC.

## 2016-09-18 MED ORDER — TETANUS-DIPHTH-ACELL PERTUSSIS 5-2.5-18.5 LF-MCG/0.5 IM SUSP
0.5000 mL | Freq: Once | INTRAMUSCULAR | Status: AC
  Administered 2016-09-18: 0.5 mL via INTRAMUSCULAR
  Filled 2016-09-18: qty 0.5

## 2016-09-18 NOTE — Discharge Instructions (Signed)
Return here as needed.  The glue will fall off on its own.  Do not pick at it.  Do not scrub over it.  Do not get it wet for 24 hours.  Return here for any signs of infection

## 2016-10-12 ENCOUNTER — Emergency Department (HOSPITAL_BASED_OUTPATIENT_CLINIC_OR_DEPARTMENT_OTHER)
Admission: EM | Admit: 2016-10-12 | Discharge: 2016-10-12 | Disposition: A | Discharge status: Home / Self Care | Payer: Medicaid Other | Attending: Emergency Medicine | Admitting: Emergency Medicine

## 2016-10-12 ENCOUNTER — Encounter (HOSPITAL_BASED_OUTPATIENT_CLINIC_OR_DEPARTMENT_OTHER): Payer: Self-pay

## 2016-10-12 DIAGNOSIS — R197 Diarrhea, unspecified: Secondary | ICD-10-CM | POA: Insufficient documentation

## 2016-10-12 DIAGNOSIS — J45909 Unspecified asthma, uncomplicated: Secondary | ICD-10-CM | POA: Insufficient documentation

## 2016-10-12 DIAGNOSIS — R05 Cough: Secondary | ICD-10-CM | POA: Insufficient documentation

## 2016-10-12 DIAGNOSIS — R69 Illness, unspecified: Secondary | ICD-10-CM

## 2016-10-12 DIAGNOSIS — J111 Influenza due to unidentified influenza virus with other respiratory manifestations: Secondary | ICD-10-CM

## 2016-10-12 DIAGNOSIS — M791 Myalgia: Secondary | ICD-10-CM | POA: Insufficient documentation

## 2016-10-12 DIAGNOSIS — Z87891 Personal history of nicotine dependence: Secondary | ICD-10-CM | POA: Insufficient documentation

## 2016-10-12 DIAGNOSIS — R509 Fever, unspecified: Secondary | ICD-10-CM | POA: Insufficient documentation

## 2016-10-12 DIAGNOSIS — R51 Headache: Secondary | ICD-10-CM | POA: Insufficient documentation

## 2016-10-12 MED ORDER — IBUPROFEN 800 MG PO TABS: 800.0000 mg | ORAL_TABLET | Freq: Three times a day (TID) | ORAL | 0 refills | Status: DC

## 2016-10-12 MED ORDER — IBUPROFEN 800 MG PO TABS
800.0000 mg | ORAL_TABLET | Freq: Once | ORAL | Status: AC
  Administered 2016-10-12: 800 mg via ORAL
  Filled 2016-10-12: qty 1

## 2016-10-12 MED ORDER — BENZONATATE 100 MG PO CAPS: 100.0000 mg | ORAL_CAPSULE | Freq: Three times a day (TID) | ORAL | 0 refills | Status: DC

## 2016-10-12 NOTE — ED Notes (Signed)
Patient c/o flu like symptoms since Monday. Patient with cough, fever/chills at home, responded to ibuprofen on arrival to ER, HA, body aches, diarrhea (1x today), decreased PO intake and appetite. Denies nausea or vomiting. Patient denies any sick contact, did not have a flu shot this year. Patient A&Ox4, NAD noted.

## 2016-10-12 NOTE — ED Triage Notes (Signed)
C/o cough x 3 days-NAD-steady gait 

## 2016-10-12 NOTE — ED Provider Notes (Signed)
[facility] Provider Note   CSN: [id number] Arrival date & time: 10/12/16  1336  By signing my name below, I, Linna Darner, attest that this documentation has been prepared under the direction and in the presence of Langston Masker, [place]. Electronically Signed: Linna Darner, Scribe. 10/12/2016. 6:32 PM.  History   Chief Complaint Chief Complaint  Patient presents with  . Cough    The history is provided by the patient. No language interpreter was used.     HPI Comments: Shawn Dillon is a 21 y.o. male who presents to the Emergency Department complaining of a persistent cough beginning 3 days ago. He reports associated headache, body aches, fever, chills, and diarrhea. He has tried TheraFlu with mild improvement of his symptoms. Pt did not receive a flu vaccination this season. No known sick contacts. Pt denies nausea, vomiting, or any other associated symptoms.  Past Medical History:  Diagnosis Date  . Eczema   . Environmental and seasonal allergies   . Fracture, metacarpal    RIGHT 4TH AND 5TH FINGER   . Wears contact lenses     Patient Active Problem List   Diagnosis Date Noted  . Well child visit 11/13/2012  . Seasonal allergies 06/24/2011  . Asthma 06/24/2011  . Eczema 06/24/2011    Past Surgical History:  Procedure Laterality Date  . CLOSED REDUCTION FINGER WITH PERCUTANEOUS PINNING Right 01/29/2015   Procedure: RIGHT SMALL FINGER AND RIGHT RING FINGER CLOSED REDUCTION PINNING;  Surgeon: Bradly Bienenstock, MD;  Location: [facility] [place];  Service: Orthopedics;  Laterality: Right;  . NO PAST SURGERIES         Home Medications    Prior to Admission medications   Not on File    Family History Family History  Problem Relation Age of Onset  . Cancer Brother   . Hypertension Brother   . Hypertension Father   . Hypertension Maternal Aunt   . Hypertension Maternal Grandmother   . Hypertension Maternal Grandfather   . Hypertension Paternal  Grandmother   . Hypertension Paternal Grandfather     Social History Social History  Substance Use Topics  . Smoking status: Former Games developer  . Smokeless tobacco: Never Used  . Alcohol use No     Allergies   Vicodin [hydrocodone-acetaminophen]   Review of Systems Review of Systems  Constitutional: Positive for chills and fever.  Respiratory: Positive for cough.   Gastrointestinal: Positive for diarrhea. Negative for nausea and vomiting.  Musculoskeletal: Positive for myalgias.  Neurological: Positive for headaches.  All other systems reviewed and are negative.    Physical Exam Updated Vital Signs BP 126/77   Pulse 98   Temp 98.6 F (37 C)   Resp 16   Ht 5\' 8"  (1.727 m)   Wt 128 lb (58.1 kg)   SpO2 99%   BMI 19.46 kg/m   Physical Exam  Constitutional: He is oriented to person, place, and time. He appears well-developed and well-nourished. No distress.  HENT:  Head: Normocephalic and atraumatic.  Eyes: Conjunctivae and EOM are normal.  Neck: Neck supple. No tracheal deviation present.  Cardiovascular: Normal rate.   Pulmonary/Chest: Effort normal. No respiratory distress.  Musculoskeletal: Normal range of motion.  Neurological: He is alert and oriented to person, place, and time.  Skin: Skin is warm and dry.  Psychiatric: He has a normal mood and affect. His behavior is normal.  Nursing note and vitals reviewed.    ED Treatments / Results  Labs (all labs  ordered are listed, but only abnormal results are displayed) Labs Reviewed - No data to display  EKG  EKG Interpretation None       Radiology No results found.  Procedures Procedures (including critical care time)  DIAGNOSTIC STUDIES: Oxygen Saturation is 99% on RA, normal by my interpretation.    COORDINATION OF CARE: 6:38 PM Discussed treatment plan with pt at bedside and pt agreed to plan.  Medications Ordered in ED Medications  ibuprofen (ADVIL,MOTRIN) tablet 800 mg (800 mg Oral Given  10/12/16 1352)     Initial Impression / Assessment and Plan / ED Course  I have reviewed the triage vital signs and the nursing notes.  Pertinent labs & imaging results that were available during my care of the patient were reviewed by me and considered in my medical decision making (see chart for details).       Final Clinical Impressions(s) / ED Diagnoses   Final diagnoses:  Influenza-like illness    New Prescriptions There are no discharge medications for this patient.  Meds ordered this encounter  Medications  . ibuprofen (ADVIL,MOTRIN) tablet 800 mg  . ibuprofen (ADVIL,MOTRIN) 800 MG tablet    Sig: Take 1 tablet (800 mg total) by mouth 3 (three) times daily.    Dispense:  21 tablet    Refill:  0    Order Specific Question:   Supervising Provider    Answer:   MILLER, BRIAN [[id number]]  . benzonatate (TESSALON) 100 MG capsule    Sig: Take 1 capsule (100 mg total) by mouth every 8 (eight) hours.    Dispense:  21 capsule    Refill:  0    Order Specific Question:   Supervising Provider    Answer:   Eber HongMILLER, BRIAN [[id number]]  An After Visit Summary was printed and given to the patient. I personally performed the services in this documentation, which was scribed in my presence.  The recorded information has been reviewed and considered.   Barnet PallKaren SofiaPAC.   Lonia SkinnerLeslie K MistonSofia, PA-C 10/13/16 0210    Loren Raceravid Yelverton, MD 10/14/16 [phone]

## 2017-04-25 ENCOUNTER — Emergency Department (HOSPITAL_BASED_OUTPATIENT_CLINIC_OR_DEPARTMENT_OTHER)
Admission: EM | Admit: 2017-04-25 | Discharge: 2017-04-25 | Disposition: A | Discharge status: Home / Self Care | Payer: Medicaid Other | Attending: Emergency Medicine | Admitting: Emergency Medicine

## 2017-04-25 ENCOUNTER — Encounter (HOSPITAL_BASED_OUTPATIENT_CLINIC_OR_DEPARTMENT_OTHER): Payer: Self-pay

## 2017-04-25 DIAGNOSIS — H1031 Unspecified acute conjunctivitis, right eye: Secondary | ICD-10-CM

## 2017-04-25 DIAGNOSIS — H1089 Other conjunctivitis: Secondary | ICD-10-CM | POA: Insufficient documentation

## 2017-04-25 DIAGNOSIS — J45909 Unspecified asthma, uncomplicated: Secondary | ICD-10-CM | POA: Insufficient documentation

## 2017-04-25 MED ORDER — TOBRAMYCIN 0.3 % OP SOLN: 2.0000 [drp] | OPHTHALMIC | 0 refills | Status: DC

## 2017-04-25 MED ORDER — FLUORESCEIN SODIUM 0.6 MG OP STRP
1.0000 | ORAL_STRIP | Freq: Once | OPHTHALMIC | Status: AC
  Administered 2017-04-25: 1 via OPHTHALMIC
  Filled 2017-04-25: qty 1

## 2017-04-25 MED ORDER — TETRACAINE HCL 0.5 % OP SOLN
2.0000 [drp] | Freq: Once | OPHTHALMIC | Status: AC
  Administered 2017-04-25: 2 [drp] via OPHTHALMIC
  Filled 2017-04-25: qty 4

## 2017-04-25 MED FILL — TOBRAMYCIN 0.3% EYE DROPS: 0.3 | 10 days supply | Qty: 5 | Fill #0

## 2017-04-25 NOTE — ED Notes (Signed)
Pt wears contacts occasionally.

## 2017-04-25 NOTE — ED Provider Notes (Signed)
[facility]ENCY [facility]PT [facility] Provider Note   CSN: [id number] Arrival date & time: 04/25/17  1014     History   Chief Complaint Chief Complaint  Patient presents with  . Facial Swelling    Right Eye     HPI Shawn Dillon is a 21 y.o. male.  Patient is a 21 year old male who presents with a right eye drainage and itching. He states he was fine last night but when he woke up this morning his eye was irritated and draining. It's mostly clear drainage but he has noted some crusting and pus as well. He denies any eye pain. He states it does feel irritated. No vision changes. He states it is a little bit blurry earlier but is back to baseline now. He occasionally wears contacts but has not worn contacts in the last week or so. He currently has his glasses on. He denies any other facial swelling no injuries to the eye.      Past Medical History:  Diagnosis Date  . Eczema   . Environmental and seasonal allergies   . Fracture, metacarpal    RIGHT 4TH AND 5TH FINGER   . Wears contact lenses     Patient Active Problem List   Diagnosis Date Noted  . Well child visit 11/13/2012  . Seasonal allergies 06/24/2011  . Asthma 06/24/2011  . Eczema 06/24/2011    Past Surgical History:  Procedure Laterality Date  . CLOSED REDUCTION FINGER WITH PERCUTANEOUS PINNING Right 01/29/2015   Procedure: RIGHT SMALL FINGER AND RIGHT RING FINGER CLOSED REDUCTION PINNING;  Surgeon: Bradly BienenstockFred Ortmann, MD;  [facility] [place];  Service: Orthopedics;  Laterality: Right;  . NO PAST SURGERIES         Home Medications    Prior to Admission medications   Medication Sig Start Date End Date Taking? Authorizing Provider  benzonatate (TESSALON) 100 MG capsule Take 1 capsule (100 mg total) by mouth every 8 (eight) hours. 10/12/16   Elson AreasSofia, Leslie K, PA-C  ibuprofen (ADVIL,MOTRIN) 800 MG tablet Take 1 tablet (800 mg total) by mouth 3 (three) times daily. 10/12/16   Elson AreasSofia, Leslie K, PA-C  tobramycin  (TOBREX) 0.3 % ophthalmic solution Place 2 drops into the left eye every 4 (four) hours. 04/25/17   Rolan BuccoBelfi, Jori Frerichs, MD    Family History Family History  Problem Relation Age of Onset  . Cancer Brother   . Hypertension Brother   . Hypertension Father   . Hypertension Maternal Aunt   . Hypertension Maternal Grandmother   . Hypertension Maternal Grandfather   . Hypertension Paternal Grandmother   . Hypertension Paternal Grandfather     Social History Social History  Substance Use Topics  . Smoking status: Former Games developermoker  . Smokeless tobacco: Never Used  . Alcohol use No     Allergies   Vicodin [hydrocodone-acetaminophen]   Review of Systems Review of Systems  Constitutional: Negative for chills, diaphoresis, fatigue and fever.  HENT: Negative for congestion, rhinorrhea and sneezing.   Eyes: Positive for discharge and redness. Negative for visual disturbance.  Respiratory: Negative for cough, chest tightness and shortness of breath.   Cardiovascular: Negative for chest pain and leg swelling.  Gastrointestinal: Negative for abdominal pain, blood in stool, diarrhea, nausea and vomiting.  Genitourinary: Negative for difficulty urinating, flank pain, frequency and hematuria.  Musculoskeletal: Negative for arthralgias and back pain.  Skin: Negative for rash.  Neurological: Negative for dizziness, speech difficulty, weakness, numbness and headaches.     Physical Exam  Updated Vital Signs BP 118/71 (BP Location: Right Arm)   Pulse (!) 58   Temp 98.2 F (36.8 C) (Oral)   Resp 13   Ht 5\' 8"  (1.727 m)   Wt 56.7 kg (125 lb)   SpO2 100%   BMI 19.01 kg/m   Physical Exam  Constitutional: He is oriented to person, place, and time. He appears well-developed and well-nourished.  HENT:  Head: Normocephalic and atraumatic.  Eyes: Pupils are equal, round, and reactive to light.  Patient has watery discharge with scant amount of purulence as well. There is erythema to the  conjunctiva. No swelling noted. No pain with eye movements. No foreign bodies noted. No dendrites or other staining areas on fluoroscein exam  Neck: Normal range of motion. Neck supple.  Cardiovascular: Normal rate, regular rhythm and normal heart sounds.   Pulmonary/Chest: Effort normal and breath sounds normal. No respiratory distress. He has no wheezes. He has no rales. He exhibits no tenderness.  Abdominal: Soft. Bowel sounds are normal. There is no tenderness. There is no rebound and no guarding.  Musculoskeletal: Normal range of motion. He exhibits no edema.  Lymphadenopathy:    He has no cervical adenopathy.  Neurological: He is alert and oriented to person, place, and time.  Skin: Skin is warm and dry. No rash noted.  Psychiatric: He has a normal mood and affect.     ED Treatments / Results  Labs (all labs ordered are listed, but only abnormal results are displayed) Labs Reviewed - No data to display  EKG  EKG Interpretation None       Radiology No results found.  Procedures Procedures (including critical care time)  Medications Ordered in ED Medications  fluorescein ophthalmic strip 1 strip (1 strip Right Eye Given by Other 04/25/17 1054)  tetracaine (PONTOCAINE) 0.5 % ophthalmic solution 2 drop (2 drops Right Eye Given by Other 04/25/17 1054)     Initial Impression / Assessment and Plan / ED Course  I have reviewed the triage vital signs and the nursing notes.  Pertinent labs & imaging results that were available during my care of the patient were reviewed by me and considered in my medical decision making (see chart for details).     Patient was started on Tobrex eyedrops. He appears to have conjunctivitis. I don't see any signs of surrounding cellulitis. He has normal vision in the eye. He was discharged home in good condition. He was encouraged to follow-up with his optometrist to have a complete eye exam within the next few days, particularly if he's not seeing  improvement with treatment.. Return precautions were given.  Final Clinical Impressions(s) / ED Diagnoses   Final diagnoses:  Acute bacterial conjunctivitis of right eye    New Prescriptions New Prescriptions   TOBRAMYCIN (TOBREX) 0.3 % OPHTHALMIC SOLUTION    Place 2 drops into the left eye every 4 (four) hours.     Rolan Bucco, MD 04/25/17 [phone]

## 2017-04-25 NOTE — ED Triage Notes (Signed)
Right eye has small edema below conjunctiva. Pain reports no pain but states that he feels like "there is a hair in my eye"

## 2018-01-25 ENCOUNTER — Other Ambulatory Visit: Payer: Self-pay

## 2018-01-25 ENCOUNTER — Encounter (HOSPITAL_BASED_OUTPATIENT_CLINIC_OR_DEPARTMENT_OTHER): Payer: Self-pay

## 2018-01-25 ENCOUNTER — Emergency Department (HOSPITAL_BASED_OUTPATIENT_CLINIC_OR_DEPARTMENT_OTHER)
Admission: EM | Admit: 2018-01-25 | Discharge: 2018-01-25 | Disposition: A | Discharge status: Home / Self Care | Payer: Self-pay | Attending: Emergency Medicine | Admitting: Emergency Medicine

## 2018-01-25 DIAGNOSIS — L723 Sebaceous cyst: Secondary | ICD-10-CM | POA: Insufficient documentation

## 2018-01-25 DIAGNOSIS — H9201 Otalgia, right ear: Secondary | ICD-10-CM

## 2018-01-25 DIAGNOSIS — J302 Other seasonal allergic rhinitis: Secondary | ICD-10-CM | POA: Insufficient documentation

## 2018-01-25 DIAGNOSIS — Z87891 Personal history of nicotine dependence: Secondary | ICD-10-CM | POA: Insufficient documentation

## 2018-01-25 MED ORDER — CETIRIZINE HCL 10 MG PO CAPS: 10.0000 mg | ORAL_CAPSULE | Freq: Every day | ORAL | 1 refills | Status: DC

## 2018-01-25 MED ORDER — FLUTICASONE PROPIONATE 50 MCG/ACT NA SUSP: 2.0000 | Freq: Every day | NASAL | 1 refills | Status: DC

## 2018-01-25 NOTE — Discharge Instructions (Addendum)
You may alternate Tylenol 1000 mg every 6 hours as needed for pain and Ibuprofen 800 mg every 8 hours as needed for pain.  Please take Ibuprofen with food.   I recommend that she take Zyrtec 10 mg every morning and Benadryl 50 mg at bedtime.  You may use Flonase nasal spray to help with nasal congestion and inflammation.  All of these medications are found over-the-counter.   You likely have a sebaceous cyst to the center of your forehead.  Please follow-up with a dermatologist as an outpatient.    [facility] Dermatologists:   [facility]  [address] [place] [address]  [phone]   Dr. Mertha Finders, MD  [address] [place]  [phone]  [facility]  [address] [place]  [phone]   [facility]  [address]  [phone]  Janalyn Harder MD  [address]  [phone]  Hoyle Sauer  [address] [place]  [phone]  [place] Amy Y MD  [address] [place]  [phone]  [facility]  [address]  [phone]

## 2018-01-25 NOTE — ED Notes (Signed)
Pt verbalizes understanding of d/c instructions and denies any further needs at this time. 

## 2018-01-25 NOTE — ED Triage Notes (Signed)
Pt c/o allergy symptoms and congestion with right ear pain, no meds tried since yesterday, also wants the bump in the middle of his forehead that has been present for over a year looked at

## 2018-01-25 NOTE — ED Provider Notes (Signed)
TIME SEEN: 2:53 AM  CHIEF COMPLAINT: Allergies, ear pain  HPI: Patient is a 22 year old male with history of seasonal allergies, eczema who presents to the emergency department with worsening seasonal allergies over the past 24 hours and right ear pain.  No fever or cough.  No sore throat.  States he is supposed to be on Zyrtec and Benadryl but has not taken these medications in the past 24 hours.  Has had some clear nasal congestion.  Also wanted to be evaluated for a lesion to his forehead that he has had for over a year.  No pain, no changes in size or character of this lesion.  No drainage.  ROS: See HPI Constitutional: no fever  Eyes: no drainage  ENT: runny nose   Cardiovascular:  no chest pain  Resp: no SOB  GI: no vomiting GU: no dysuria Integumentary: no rash  Allergy: no hives  Musculoskeletal: no leg swelling  Neurological: no slurred speech ROS otherwise negative  PAST MEDICAL HISTORY/PAST SURGICAL HISTORY:  Past Medical History:  Diagnosis Date  . Eczema   . Environmental and seasonal allergies   . Fracture, metacarpal    RIGHT 4TH AND 5TH FINGER   . Wears contact lenses     MEDICATIONS:  Prior to Admission medications   Medication Sig Start Date End Date Taking? Authorizing Provider  benzonatate (TESSALON) 100 MG capsule Take 1 capsule (100 mg total) by mouth every 8 (eight) hours. 10/12/16   Elson Areas, PA-C  ibuprofen (ADVIL,MOTRIN) 800 MG tablet Take 1 tablet (800 mg total) by mouth 3 (three) times daily. 10/12/16   Elson Areas, PA-C  tobramycin (TOBREX) 0.3 % ophthalmic solution Place 2 drops into the left eye every 4 (four) hours. 04/25/17   Rolan Bucco, MD    ALLERGIES:  Allergies  Allergen Reactions  . Vicodin [Hydrocodone-Acetaminophen] Hives    SOCIAL HISTORY:  Social History   Tobacco Use  . Smoking status: Former Games developer  . Smokeless tobacco: Never Used  Substance Use Topics  . Alcohol use: No    FAMILY HISTORY: Family History   Problem Relation Age of Onset  . Cancer Brother   . Hypertension Brother   . Hypertension Father   . Hypertension Maternal Aunt   . Hypertension Maternal Grandmother   . Hypertension Maternal Grandfather   . Hypertension Paternal Grandmother   . Hypertension Paternal Grandfather     EXAM: BP 131/90 (BP Location: Right Arm)   Pulse 60   Temp 98.1 F (36.7 C) (Oral)   Resp 16   Ht  (1.753 m)   Wt 56.7 kg (125 lb)   SpO2 100%   BMI 18.46 kg/m  CONSTITUTIONAL: Alert and oriented and responds appropriately to questions. Well-appearing; well-nourished HEAD: Normocephalic, patient has a 1 x 1 cm slightly darkened area to the center of the forehead that is slightly raised without drainage, redness, warmth, fluctuance or induration EYES: Conjunctivae clear, pupils appear equal, EOMI ENT: normal nose; moist mucous membranes, clear nasal congestion noted to both nostrils with slightly inflamed and pale appearing bilateral nasal turbinates; TMs are clear bilaterally without erythema, purulence, bulging, perforation, effusion.  No cerumen impaction or sign of foreign body in the external auditory canal. No inflammation, erythema or drainage from the external auditory canal. No signs of mastoiditis. No pain with manipulation of the pinna bilaterally.  No pharyngeal erythema or petechiae, no tonsillar hypertrophy or exudate, no uvular deviation, no unilateral swelling, no trismus or drooling, no muffled voice,  normal phonation, no stridor, no dental caries present, no drainable dental abscess noted, no Ludwig's angina, tongue sits flat in the bottom of the mouth, no angioedema, no facial erythema or warmth, no facial swelling; no pain with movement of the neck. NECK: Supple, no meningismus, no nuchal rigidity, no LAD  CARD: RRR; S1 and S2 appreciated; no murmurs, no clicks, no rubs, no gallops RESP: Normal chest excursion without splinting or tachypnea; breath sounds clear and equal bilaterally;  no wheezes, no rhonchi, no rales, no hypoxia or respiratory distress, speaking full sentences ABD/GI: Normal bowel sounds; non-distended; soft, non-tender, no rebound, no guarding, no peritoneal signs, no hepatosplenomegaly BACK:  The back appears normal and is non-tender to palpation, there is no CVA tenderness EXT: Normal ROM in all joints; non-tender to palpation; no edema; normal capillary refill; no cyanosis, no calf tenderness or swelling    SKIN: Normal color for age and race; warm; no rash NEURO: Moves all extremities equally PSYCH: The patient's mood and manner are appropriate. Grooming and personal hygiene are appropriate.  MEDICAL DECISION MAKING: Patient here with seasonal allergies.  No signs of otitis media, otitis externa, mastoiditis.  No signs of pneumonia, meningitis.  Have recommended he resume Zyrtec, Benadryl and will also discharge with Flonase.  He does not need antibiotics at this time.  He is very well-appearing.  As for the lesion on his forehead, I suspect this is a sebaceous cyst.  It does not appear infected at this time does not need drainage.  Have given him outpatient PCP and dermatology follow-up information.   At this time, I do not feel there is any life-threatening condition present. I have reviewed and discussed all results (EKG, imaging, lab, urine as appropriate) and exam findings with patient/family. I have reviewed nursing notes and appropriate previous records.  I feel the patient is safe to be discharged home without further emergent workup and can continue workup as an outpatient as needed. Discussed usual and customary return precautions. Patient/family verbalize understanding and are comfortable with this plan.  Outpatient follow-up has been provided if needed. All questions have been answered.      Abdiaziz Klahn, Layla Maw, DO 01/25/18 [phone]

## 2018-05-15 ENCOUNTER — Emergency Department (HOSPITAL_BASED_OUTPATIENT_CLINIC_OR_DEPARTMENT_OTHER): Payer: Medicaid Other

## 2018-05-15 ENCOUNTER — Encounter (HOSPITAL_BASED_OUTPATIENT_CLINIC_OR_DEPARTMENT_OTHER): Payer: Self-pay | Admitting: *Deleted

## 2018-05-15 ENCOUNTER — Emergency Department (HOSPITAL_BASED_OUTPATIENT_CLINIC_OR_DEPARTMENT_OTHER)
Admission: EM | Admit: 2018-05-15 | Discharge: 2018-05-15 | Disposition: A | Discharge status: Home / Self Care | Payer: Medicaid Other | Attending: Emergency Medicine | Admitting: Emergency Medicine

## 2018-05-15 ENCOUNTER — Other Ambulatory Visit: Payer: Self-pay

## 2018-05-15 DIAGNOSIS — Z87891 Personal history of nicotine dependence: Secondary | ICD-10-CM | POA: Insufficient documentation

## 2018-05-15 DIAGNOSIS — J45909 Unspecified asthma, uncomplicated: Secondary | ICD-10-CM | POA: Insufficient documentation

## 2018-05-15 DIAGNOSIS — B9789 Other viral agents as the cause of diseases classified elsewhere: Secondary | ICD-10-CM | POA: Insufficient documentation

## 2018-05-15 DIAGNOSIS — J069 Acute upper respiratory infection, unspecified: Secondary | ICD-10-CM | POA: Insufficient documentation

## 2018-05-15 MED ORDER — ALBUTEROL SULFATE 108 (90 BASE) MCG/ACT IN AEPB: 1.0000 | INHALATION_SPRAY | Freq: Four times a day (QID) | RESPIRATORY_TRACT | 0 refills | Status: DC | PRN

## 2018-05-15 MED ORDER — ALBUTEROL SULFATE (2.5 MG/3ML) 0.083% IN NEBU
INHALATION_SOLUTION | RESPIRATORY_TRACT | Status: AC
  Administered 2018-05-15: 2.5 mg
  Filled 2018-05-15: qty 3

## 2018-05-15 MED ORDER — IPRATROPIUM-ALBUTEROL 0.5-2.5 (3) MG/3ML IN SOLN
RESPIRATORY_TRACT | Status: AC
  Administered 2018-05-15: 3 mL
  Filled 2018-05-15: qty 3

## 2018-05-15 NOTE — ED Triage Notes (Signed)
2 days ago he got a cold with cough and headache.

## 2018-05-15 NOTE — ED Notes (Signed)
Patient transported to X-ray 

## 2018-05-15 NOTE — Discharge Instructions (Addendum)
Your chest x-ray was clear today. No signs of infection. You likely have viral respiratory infection. You may use over-the-counter cold and flu products until you get better. I have written you a new prescription for an Albuterol inhaler that you can use at home if you have more wheezes.

## 2018-05-15 NOTE — ED Provider Notes (Signed)
[facility] EMERGENCY DEPARTMENT Provider Note  CSN: [id number] Arrival date & time: 05/15/18  1130   History   Chief Complaint Chief Complaint  Patient presents with  . Fever  . Cough    HPI Shawn Dillon is a 22 y.o. male with a medical history of asthma who presented to the ED for cough x3 days. Associated symptoms: subjective fever, wheezing and nasal congestion. Patient endorses 3 sick contacts in his family who have had similar symptoms. Patient has tried nothing prior to coming to the ED. Denies chest pain, abdominal pain, N/V, sore throat, rhinorrhea and postnasal drip.   Past Medical History:  Diagnosis Date  . Eczema   . Environmental and seasonal allergies   . Fracture, metacarpal    RIGHT 4TH AND 5TH FINGER   . Wears contact lenses     Patient Active Problem List   Diagnosis Date Noted  . Well child visit 11/13/2012  . Seasonal allergies 06/24/2011  . Asthma 06/24/2011  . Eczema 06/24/2011    Past Surgical History:  Procedure Laterality Date  . CLOSED REDUCTION FINGER WITH PERCUTANEOUS PINNING Right 01/29/2015   Procedure: RIGHT SMALL FINGER AND RIGHT RING FINGER CLOSED REDUCTION PINNING;  Surgeon: Bradly Bienenstock, MD;  Location: [facility];  Service: Orthopedics;  Laterality: Right;  . NO PAST SURGERIES          Home Medications    Prior to Admission medications   Medication Sig Start Date End Date Taking? Authorizing Provider  Albuterol Sulfate (PROAIR RESPICLICK) 108 (90 Base) MCG/ACT AEPB Inhale 1-2 puffs into the lungs every 6 (six) hours as needed. 05/15/18   Alannah Averhart, Jerrel Ivory I, PA-C  benzonatate (TESSALON) 100 MG capsule Take 1 capsule (100 mg total) by mouth every 8 (eight) hours. 10/12/16   Elson Areas, PA-C  Cetirizine HCl (ZYRTEC ALLERGY) 10 MG CAPS Take 1 capsule (10 mg total) by mouth daily. 01/25/18   Ward, Layla Maw, DO  fluticasone (FLONASE) 50 MCG/ACT nasal spray Place 2 sprays into both nostrils daily. 01/25/18    Ward, Layla Maw, DO  ibuprofen (ADVIL,MOTRIN) 800 MG tablet Take 1 tablet (800 mg total) by mouth 3 (three) times daily. 10/12/16   Elson Areas, PA-C  tobramycin (TOBREX) 0.3 % ophthalmic solution Place 2 drops into the left eye every 4 (four) hours. 04/25/17   Rolan Bucco, MD    Family History Family History  Problem Relation Age of Onset  . Cancer Brother   . Hypertension Brother   . Hypertension Father   . Hypertension Maternal Aunt   . Hypertension Maternal Grandmother   . Hypertension Maternal Grandfather   . Hypertension Paternal Grandmother   . Hypertension Paternal Grandfather     Social History Social History   Tobacco Use  . Smoking status: Former Games developer  . Smokeless tobacco: Never Used  Substance Use Topics  . Alcohol use: No  . Drug use: No     Allergies   Vicodin [hydrocodone-acetaminophen]   Review of Systems Review of Systems  Constitutional: Negative for chills and fever.  HENT: Positive for congestion. Negative for ear pain, postnasal drip, rhinorrhea, sinus pressure, sinus pain and sore throat.   Respiratory: Positive for cough, shortness of breath and wheezing. Negative for choking and chest tightness.   Cardiovascular: Negative for chest pain, palpitations and leg swelling.  Gastrointestinal: Negative.   Skin: Negative.      Physical Exam Updated Vital Signs BP 121/75 (BP Location: Right Arm)   Pulse  68   Temp 98.5 F (36.9 C) (Oral)   Resp 16   Ht 5\' 9"  (1.753 m)   Wt 56.7 kg   SpO2 100%   BMI 18.46 kg/m   Physical Exam  Constitutional: Vital signs are normal. He appears well-developed and well-nourished. No distress.  HENT:  Head: Normocephalic and atraumatic.  Right Ear: Tympanic membrane, external ear and ear canal normal.  Left Ear: Tympanic membrane, external ear and ear canal normal.  Nose: Nose normal. Right sinus exhibits no maxillary sinus tenderness and no frontal sinus tenderness. Left sinus exhibits no maxillary  sinus tenderness and no frontal sinus tenderness.  Mouth/Throat: Uvula is midline, oropharynx is clear and moist and mucous membranes are normal.  Eyes: Pupils are equal, round, and reactive to light. Conjunctivae, EOM and lids are normal.  Neck: Normal range of motion and full passive range of motion without pain. Neck supple.  Cardiovascular: Normal rate, regular rhythm and normal heart sounds.  Pulmonary/Chest: Effort normal. No tachypnea. No respiratory distress. He has wheezes in the right upper field and the right middle field.  Lymphadenopathy:    He has no cervical adenopathy.  Skin: Skin is warm and intact. He is not diaphoretic.  Nursing note and vitals reviewed.    ED Treatments / Results  Labs (all labs ordered are listed, but only abnormal results are displayed) Labs Reviewed - No data to display  EKG None  Radiology Dg Chest 2 View  Result Date: 05/15/2018 CLINICAL DATA:  Shortness of breath, fever, cough and chest pain for 1 week. EXAM: CHEST - 2 VIEW COMPARISON:  None. FINDINGS: The chest is hyperexpanded but the lungs are clear. Heart size is normal. No pneumothorax or pleural effusion. No acute or focal bony abnormality. IMPRESSION: Pulmonary hyperexpansion.  The lungs are clear. Electronically Signed   By: Drusilla Kannerhomas  Dalessio M.D.   On: 05/15/2018 13:05    Procedures Procedures (including critical care time)  Medications Ordered in ED Medications  albuterol (PROVENTIL) (2.5 MG/3ML) 0.083% nebulizer solution (2.5 mg  Given 05/15/18 1147)  ipratropium-albuterol (DUONEB) 0.5-2.5 (3) MG/3ML nebulizer solution (3 mLs  Given 05/15/18 1147)     Initial Impression / Assessment and Plan / ED Course  Triage vital signs and the nursing notes have been reviewed.  Pertinent labs & imaging results that were available during care of the patient were reviewed and considered in medical decision making (see chart for details).  Patient presents afebrile with complaints of cough,  congestion, wheezes and subjective fever. He is well appearing. Patient endorses multiple sick contacts with family members who have had similar complaints. On physical exam, patient is not in respiratory distress and has oxygen saturations > 98%. Physical exam and history consistent with viral URI; however, given wheezes and some rales heard on exam will order CXR to rule out PNA.  Clinical Course as of May 15 1524  Tue May 15, 2018  1100 Duo-Neb treatment started for patient by RN prior to provider evaluation of patient   [GM]  1324 CXR normal. No signs of PNA. No consolidation, vascular congestion or intra/extra-pleural abnormalities.   [GM]    Clinical Course User Index [GM] Meleana Commerford, Sharyon MedicusGabrielle I, PA-C    Final Clinical Impressions(s) / ED Diagnoses  1. Viral URI. Education provided on OTC and supportive treatment for symptom relief. Rx for Albuterol inhaler given asthma history and recent wheezes in context of this URI.  Dispo: Home. After thorough clinical evaluation, this patient is determined to be medically  stable and can be safely discharged with the previously mentioned treatment and/or outpatient follow-up/referral(s). At this time, there are no other apparent medical conditions that require further screening, evaluation or treatment.  Final diagnoses:  Viral upper respiratory tract infection    ED Discharge Orders         Ordered    Albuterol Sulfate (PROAIR RESPICLICK) 108 (90 Base) MCG/ACT AEPB  Every 6 hours PRN     05/15/18 1417            Blinda Turek, Sharyon Medicus, PA-C 05/15/18 1525    Virgina Norfolk, DO 05/15/18 1548

## 2018-05-17 ENCOUNTER — Emergency Department (HOSPITAL_BASED_OUTPATIENT_CLINIC_OR_DEPARTMENT_OTHER): Payer: Medicaid Other

## 2018-05-17 ENCOUNTER — Encounter (HOSPITAL_BASED_OUTPATIENT_CLINIC_OR_DEPARTMENT_OTHER): Payer: Self-pay

## 2018-05-17 ENCOUNTER — Other Ambulatory Visit: Payer: Self-pay

## 2018-05-17 ENCOUNTER — Emergency Department (HOSPITAL_BASED_OUTPATIENT_CLINIC_OR_DEPARTMENT_OTHER)
Admission: EM | Admit: 2018-05-17 | Discharge: 2018-05-18 | Disposition: A | Discharge status: Home / Self Care | Payer: Medicaid Other | Attending: Emergency Medicine | Admitting: Emergency Medicine

## 2018-05-17 DIAGNOSIS — J45909 Unspecified asthma, uncomplicated: Secondary | ICD-10-CM | POA: Insufficient documentation

## 2018-05-17 DIAGNOSIS — Z79899 Other long term (current) drug therapy: Secondary | ICD-10-CM | POA: Insufficient documentation

## 2018-05-17 DIAGNOSIS — R0789 Other chest pain: Secondary | ICD-10-CM | POA: Insufficient documentation

## 2018-05-17 NOTE — ED Notes (Signed)
ED Provider at bedside. 

## 2018-05-17 NOTE — ED Triage Notes (Signed)
C/o CP x today-was seen 2 days ago for cough-denies at present-NAD-steady gait

## 2018-05-17 NOTE — ED Notes (Addendum)
Rad tech states she spoke with EDP-orders to d/c CXR due to pt had one 2 days ago-[facility]

## 2018-05-17 NOTE — ED Provider Notes (Signed)
[facility] EMERGENCY DEPARTMENT Provider Note   CSN: [id number] Arrival date & time: 05/17/18  2145     History   Chief Complaint Chief Complaint  Patient presents with  . Chest Pain    HPI Loreta Avelex D Hinnenkamp is a 22 y.o. male.  Patient is a 22 year old male presenting with complaints of right-sided chest discomfort.  This started earlier this afternoon.  He was seen 2 days ago for a cough and had a chest x-ray which was unremarkable.  He describes a vague pain to the right side of his chest that is worse with palpation and movement.  He denies any shortness of breath, fevers, chills, or productive cough.  The history is provided by the patient.  Chest Pain   This is a new problem. The current episode started yesterday. The problem occurs constantly. The problem has not changed since onset.The pain is associated with breathing, movement and raising an arm. The pain is present in the lateral region. The pain is mild. The quality of the pain is described as sharp. The pain does not radiate. Pertinent negatives include no cough, no shortness of breath and no sputum production. He has tried nothing for the symptoms.    Past Medical History:  Diagnosis Date  . Eczema   . Environmental and seasonal allergies   . Fracture, metacarpal    RIGHT 4TH AND 5TH FINGER   . Wears contact lenses     Patient Active Problem List   Diagnosis Date Noted  . Well child visit 11/13/2012  . Seasonal allergies 06/24/2011  . Asthma 06/24/2011  . Eczema 06/24/2011    Past Surgical History:  Procedure Laterality Date  . CLOSED REDUCTION FINGER WITH PERCUTANEOUS PINNING Right 01/29/2015   Procedure: RIGHT SMALL FINGER AND RIGHT RING FINGER CLOSED REDUCTION PINNING;  Surgeon: Bradly BienenstockFred Ortmann, MD;  [facility] ;  Service: Orthopedics;  Laterality: Right;  . NO PAST SURGERIES          Home Medications    Prior to Admission medications   Medication Sig Start Date End  Date Taking? Authorizing Provider  Albuterol Sulfate (PROAIR RESPICLICK) 108 (90 Base) MCG/ACT AEPB Inhale 1-2 puffs into the lungs every 6 (six) hours as needed. 05/15/18   Mortis, Jerrel IvoryGabrielle I, PA-C  Cetirizine HCl (ZYRTEC ALLERGY) 10 MG CAPS Take 1 capsule (10 mg total) by mouth daily. 01/25/18   Ward, Layla MawKristen N, DO  fluticasone (FLONASE) 50 MCG/ACT nasal spray Place 2 sprays into both nostrils daily. 01/25/18   Ward, Layla MawKristen N, DO  ibuprofen (ADVIL,MOTRIN) 800 MG tablet Take 1 tablet (800 mg total) by mouth 3 (three) times daily. 10/12/16   Elson AreasSofia, Leslie K, PA-C    Family History Family History  Problem Relation Age of Onset  . Cancer Brother   . Hypertension Brother   . Hypertension Father   . Hypertension Maternal Aunt   . Hypertension Maternal Grandmother   . Hypertension Maternal Grandfather   . Hypertension Paternal Grandmother   . Hypertension Paternal Grandfather     Social History Social History   Tobacco Use  . Smoking status: Never Smoker  . Smokeless tobacco: Never Used  Substance Use Topics  . Alcohol use: No  . Drug use: Yes    Types: Marijuana     Allergies   Vicodin [hydrocodone-acetaminophen]   Review of Systems Review of Systems  Respiratory: Negative for cough, sputum production and shortness of breath.   Cardiovascular: Positive for chest pain.  All other  systems reviewed and are negative.    Physical Exam Updated Vital Signs BP 121/71 (BP Location: Left Arm)   Pulse 78   Temp 98.3 F (36.8 C) (Oral)   Resp 18   Ht 5\' 9"  (1.753 m)   Wt 57.6 kg   SpO2 99%   BMI 18.75 kg/m   Physical Exam  Constitutional: He is oriented to person, place, and time. He appears well-developed and well-nourished. No distress.  HENT:  Head: Normocephalic and atraumatic.  Mouth/Throat: Oropharynx is clear and moist.  Neck: Normal range of motion. Neck supple.  Cardiovascular: Normal rate and regular rhythm. Exam reveals no friction rub.  No murmur  heard. Pulmonary/Chest: Effort normal and breath sounds normal. No respiratory distress. He has no wheezes. He has no rales.  There is tenderness to the right lateral chest wall which reproduces his symptoms.  Abdominal: Soft. Bowel sounds are normal. He exhibits no distension. There is no tenderness.  Musculoskeletal: Normal range of motion. He exhibits no edema.  Neurological: He is alert and oriented to person, place, and time. Coordination normal.  Skin: Skin is warm and dry. He is not diaphoretic.  Nursing note and vitals reviewed.    ED Treatments / Results  Labs (all labs ordered are listed, but only abnormal results are displayed) Labs Reviewed - No data to display  EKG EKG Interpretation  Date/Time:  Thursday May 17 2018 21:50:39 EDT Ventricular Rate:  88 PR Interval:  118 QRS Duration: 84 QT Interval:  326 QTC Calculation: 394 R Axis:   80 Text Interpretation:  Normal sinus rhythm with sinus arrhythmia Right atrial enlargement Borderline ECG no prior available for comparison Confirmed by Tilden Fossa [phone]) on 05/17/2018 11:09:16 PM   Radiology No results found.  Procedures Procedures (including critical care time)  Medications Ordered in ED Medications - No data to display   Initial Impression / Assessment and Plan / ED Course  I have reviewed the triage vital signs and the nursing notes.  Pertinent labs & imaging results that were available during my care of the patient were reviewed by me and considered in my medical decision making (see chart for details).  Symptoms are most likely musculoskeletal in nature.  His EKG is normal and pain is reproducible with palpation.  He had a chest x-ray 2 days ago which was normal and I see no indication to repeat this.  I highly suspect a musculoskeletal nature and will treat with anti-inflammatories, rest, and follow-up as needed.  I have considered pulmonary embolism, however he has no risk factors for this, no  tachycardia, no hypoxia.  Final Clinical Impressions(s) / ED Diagnoses   Final diagnoses:  None    ED Discharge Orders    None       Geoffery Lyons, MD 05/18/18 0001

## 2018-05-18 NOTE — Discharge Instructions (Addendum)
Ibuprofen 600 mg 3 times daily for the next 5 days.  Continue use of your inhaler as needed.  Return to the emergency department if symptoms worsen or change.

## 2018-06-11 IMAGING — CR DG FINGER THUMB 2+V*R*
3 series · 3 of 3 positions shown · non-contrast
Comparison: None.

CLINICAL DATA: Patient with right thumb injury playing football.
Continuous pain. Initial encounter.

EXAM:
RIGHT THUMB 2+V

[x finger pa right]
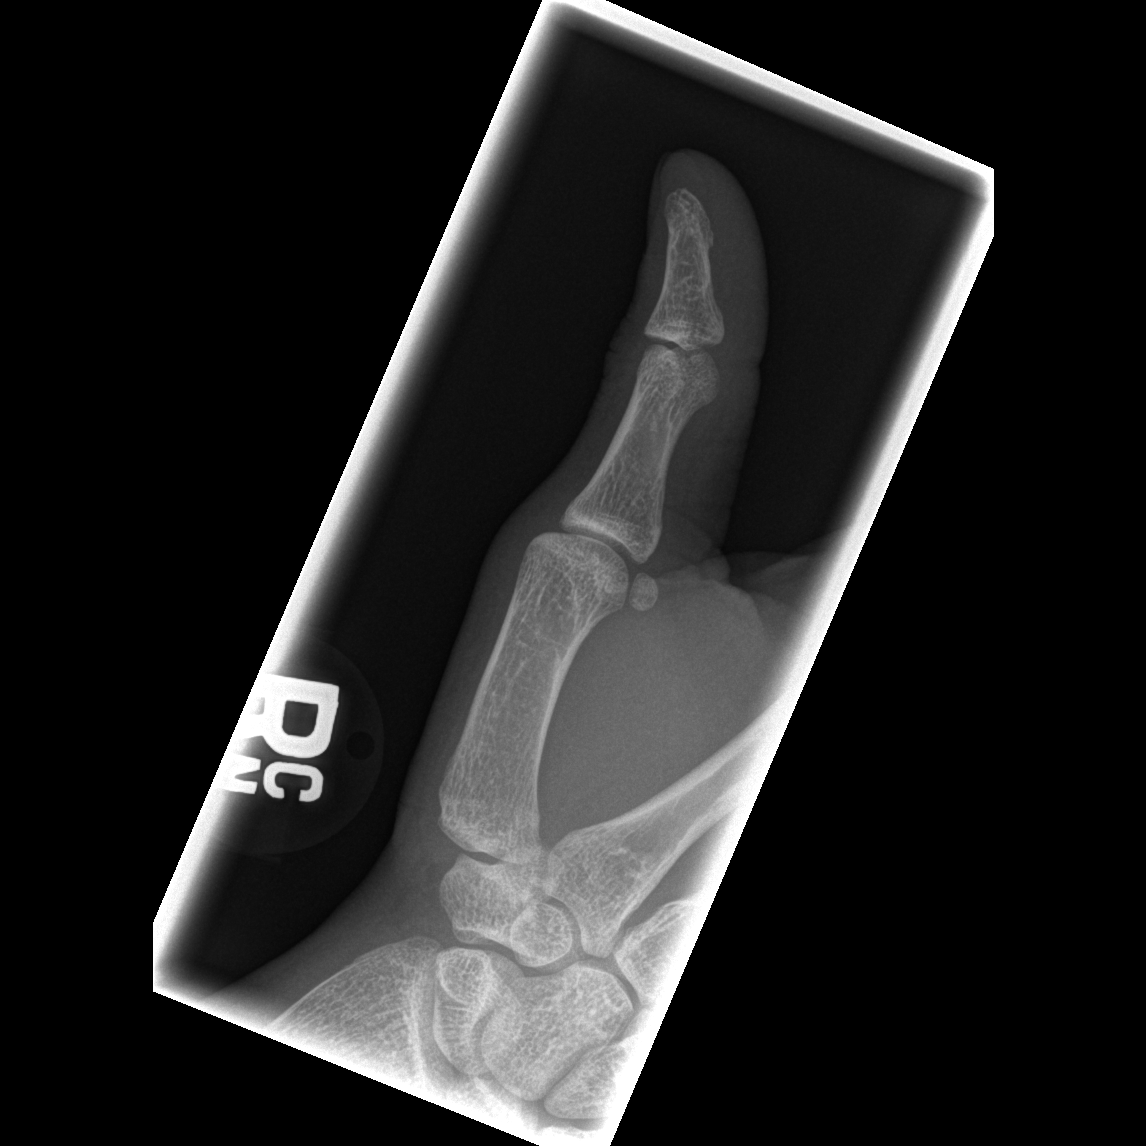

[x finger obl. right]
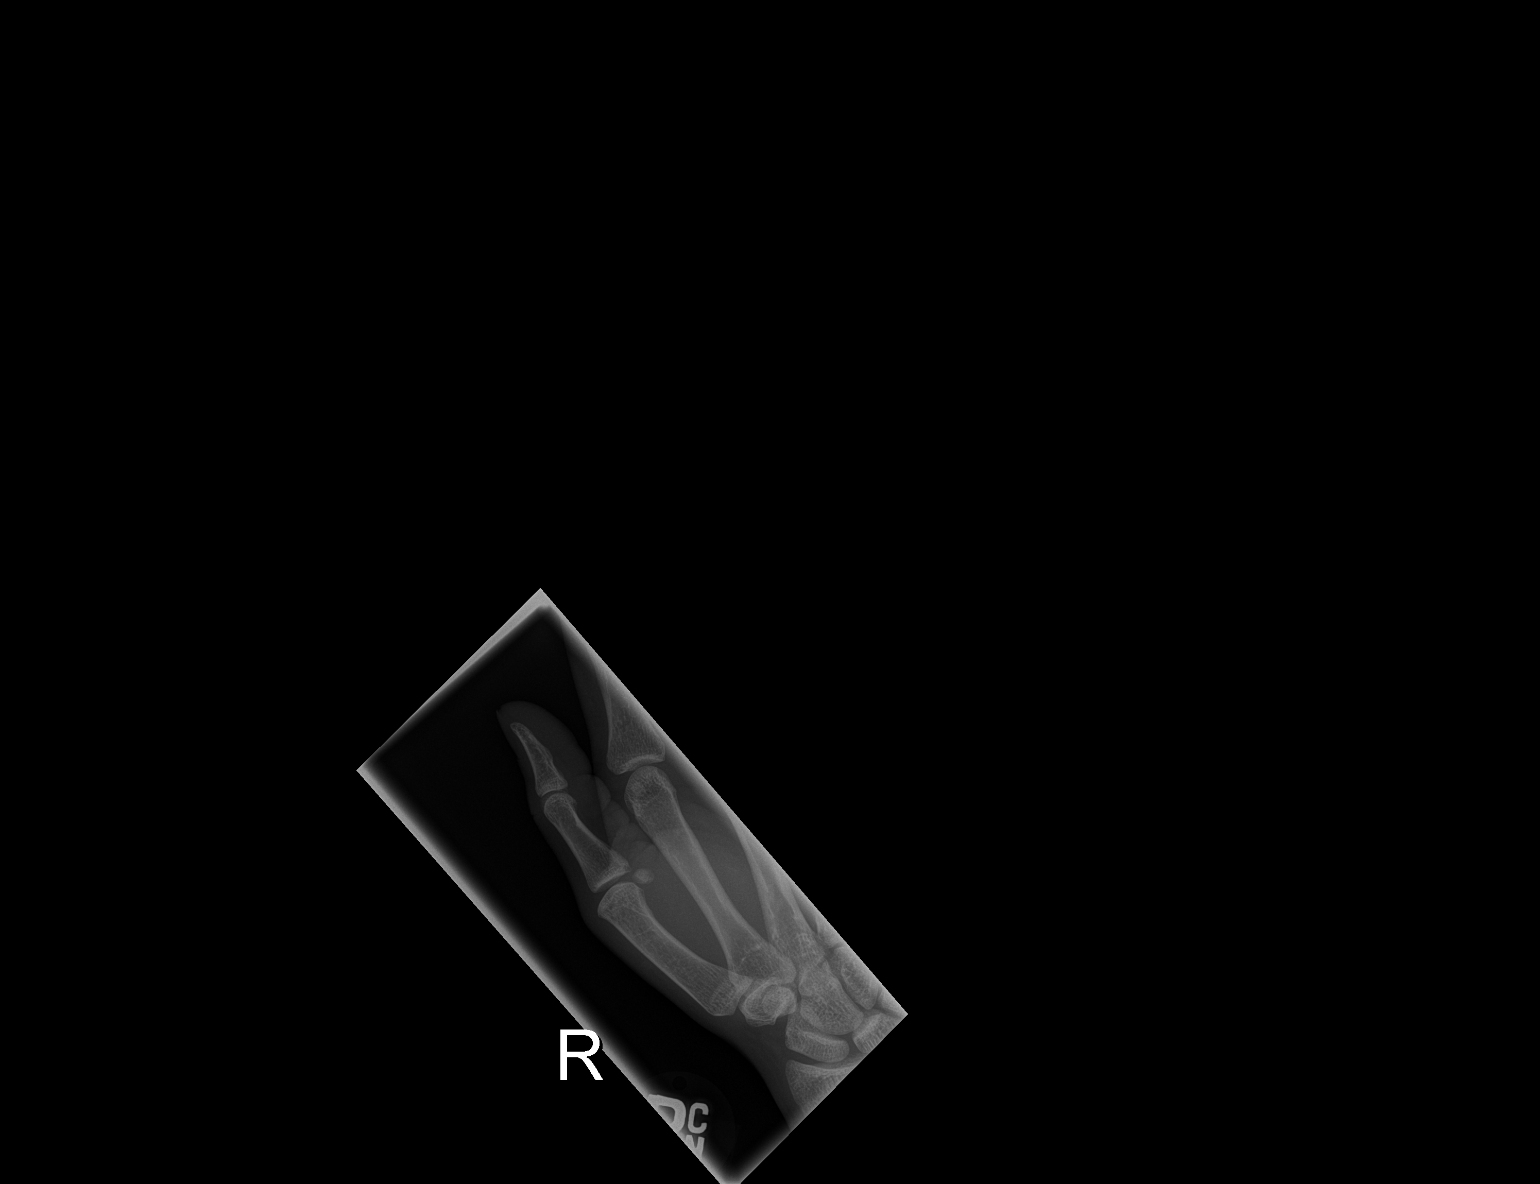

[x finger lateral right]
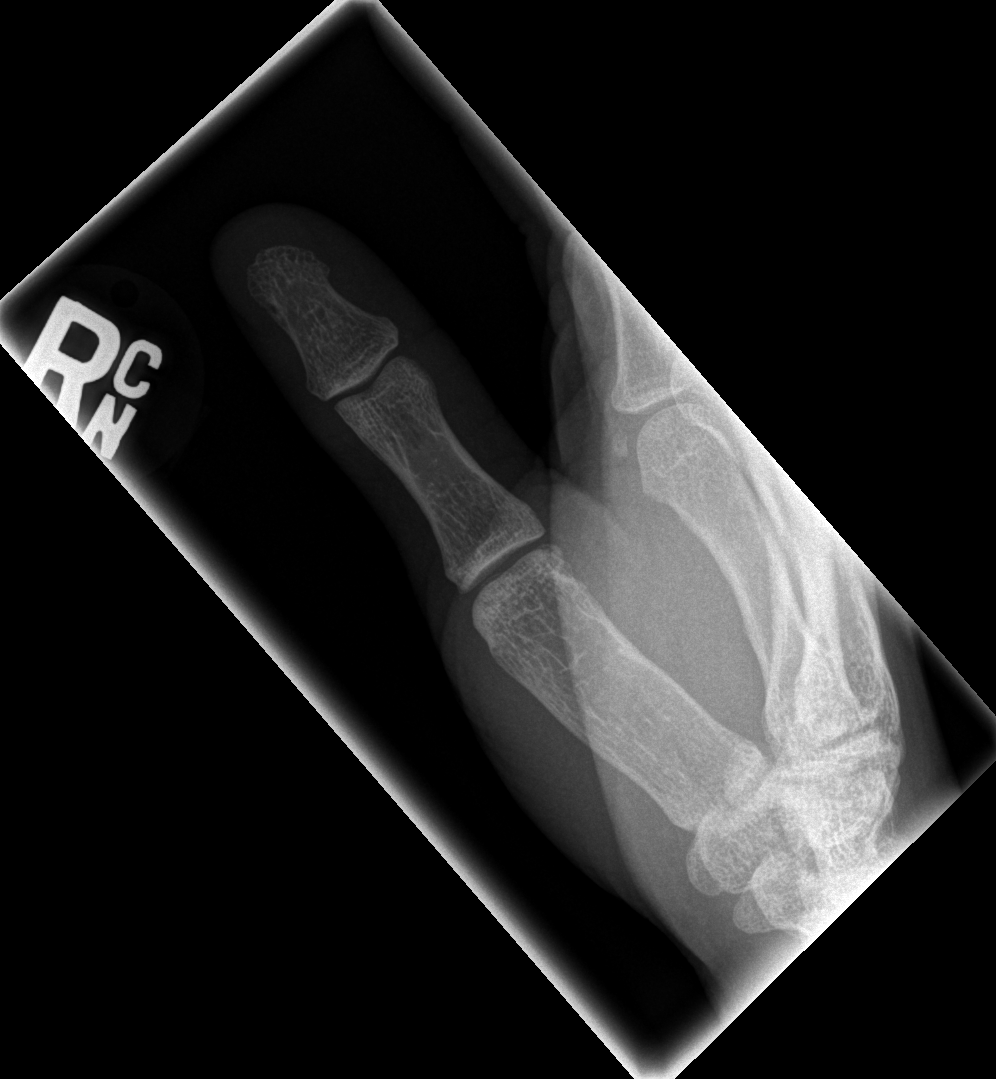

[3 of 3 positions shown; findings below may reference images not displayed]

FINDINGS: There is no evidence of fracture or dislocation. There is no
evidence of arthropathy or other focal bone abnormality. Soft
tissues are unremarkable
IMPRESSION: Negative.

## 2018-11-14 ENCOUNTER — Emergency Department (HOSPITAL_BASED_OUTPATIENT_CLINIC_OR_DEPARTMENT_OTHER)
Admission: EM | Admit: 2018-11-14 | Discharge: 2018-11-14 | Disposition: A | Discharge status: Home / Self Care | Payer: Medicaid Other | Attending: Emergency Medicine | Admitting: Emergency Medicine

## 2018-11-14 ENCOUNTER — Encounter (HOSPITAL_BASED_OUTPATIENT_CLINIC_OR_DEPARTMENT_OTHER): Payer: Self-pay

## 2018-11-14 ENCOUNTER — Other Ambulatory Visit: Payer: Self-pay

## 2018-11-14 DIAGNOSIS — R1110 Vomiting, unspecified: Secondary | ICD-10-CM | POA: Insufficient documentation

## 2018-11-14 DIAGNOSIS — F1210 Cannabis abuse, uncomplicated: Secondary | ICD-10-CM | POA: Insufficient documentation

## 2018-11-14 DIAGNOSIS — Z79899 Other long term (current) drug therapy: Secondary | ICD-10-CM | POA: Insufficient documentation

## 2018-11-14 DIAGNOSIS — R5383 Other fatigue: Secondary | ICD-10-CM | POA: Insufficient documentation

## 2018-11-14 DIAGNOSIS — J029 Acute pharyngitis, unspecified: Secondary | ICD-10-CM | POA: Insufficient documentation

## 2018-11-14 DIAGNOSIS — J45909 Unspecified asthma, uncomplicated: Secondary | ICD-10-CM | POA: Insufficient documentation

## 2018-11-14 LAB — GROUP A STREP BY PCR: Group A Strep by PCR: NOT DETECTED

## 2018-11-14 NOTE — ED Triage Notes (Signed)
C/o sore throat x 3 days-NAD-steady gait 

## 2018-11-14 NOTE — ED Provider Notes (Signed)
[facility] EMERGENCY DEPARTMENT Provider Note   CSN: [id number] Arrival date & time: 11/14/18  1215    History   Chief Complaint Chief Complaint  Patient presents with  . Sore Throat    HPI Shawn Dillon is a 23 y.o. male.     The history is provided by the patient. No language interpreter was used.  Sore Throat    Shawn Dillon is a 23 y.o. male who presents to the Emergency Department complaining of sore throat. He presents to the emergency department complaining of sore throat that began 2 to 3 days ago. Pain is located throughout his throat and is described as a soreness and pain with swallowing. He has associated subjective fevers. He did vomit once yesterday. He denies any coughing, difficulty breathing, abdominal pain, diarrhea. Symptoms are moderate and constant in nature. He does have associated fatigue. Past Medical History:  Diagnosis Date  . Eczema   . Environmental and seasonal allergies   . Fracture, metacarpal    RIGHT 4TH AND 5TH FINGER   . Wears contact lenses     Patient Active Problem List   Diagnosis Date Noted  . Well child visit 11/13/2012  . Seasonal allergies 06/24/2011  . Asthma 06/24/2011  . Eczema 06/24/2011    Past Surgical History:  Procedure Laterality Date  . CLOSED REDUCTION FINGER WITH PERCUTANEOUS PINNING Right 01/29/2015   Procedure: RIGHT SMALL FINGER AND RIGHT RING FINGER CLOSED REDUCTION PINNING;  Surgeon: Bradly Bienenstock, MD;  Location: [facility];  Service: Orthopedics;  Laterality: Right;  . NO PAST SURGERIES          Home Medications    Prior to Admission medications   Medication Sig Start Date End Date Taking? Authorizing Provider  ibuprofen (ADVIL,MOTRIN) 800 MG tablet Take 1 tablet (800 mg total) by mouth 3 (three) times daily. 10/12/16  Yes Cheron Schaumann K, PA-C  Albuterol Sulfate (PROAIR RESPICLICK) 108 (90 Base) MCG/ACT AEPB Inhale 1-2 puffs into the lungs every 6 (six) hours as needed. 05/15/18    Mortis, Jerrel Ivory I, PA-C  Cetirizine HCl (ZYRTEC ALLERGY) 10 MG CAPS Take 1 capsule (10 mg total) by mouth daily. 01/25/18   Ward, Layla Maw, DO  fluticasone (FLONASE) 50 MCG/ACT nasal spray Place 2 sprays into both nostrils daily. 01/25/18   Ward, Layla Maw, DO    Family History Family History  Problem Relation Age of Onset  . Cancer Brother   . Hypertension Brother   . Hypertension Father   . Hypertension Maternal Aunt   . Hypertension Maternal Grandmother   . Hypertension Maternal Grandfather   . Hypertension Paternal Grandmother   . Hypertension Paternal Grandfather     Social History Social History   Tobacco Use  . Smoking status: Never Smoker  . Smokeless tobacco: Never Used  Substance Use Topics  . Alcohol use: No  . Drug use: Yes    Types: Marijuana     Allergies   Vicodin [hydrocodone-acetaminophen]   Review of Systems Review of Systems  All other systems reviewed and are negative.    Physical Exam Updated Vital Signs BP 124/79 (BP Location: Left Arm)   Temp 98.3 F (36.8 C) (Oral)   Resp 18   SpO2 100%   Physical Exam Vitals signs and nursing note reviewed.  Constitutional:      Appearance: He is well-developed.  HENT:     Head: Normocephalic and atraumatic.     Comments: Moderate erythema in the posterior oropharynx  with mild tonsilar edema and tonsilar exudates Neck:     Musculoskeletal: Neck supple.     Comments: Mild anterior cervical lymphadenopathy Cardiovascular:     Rate and Rhythm: Normal rate and regular rhythm.     Heart sounds: No murmur.  Pulmonary:     Effort: Pulmonary effort is normal. No respiratory distress.     Breath sounds: Normal breath sounds.  Abdominal:     Palpations: Abdomen is soft.     Tenderness: There is no abdominal tenderness. There is no guarding or rebound.  Musculoskeletal:        General: No tenderness.  Skin:    General: Skin is warm and dry.  Neurological:     Mental Status: He is alert and  oriented to person, place, and time.  Psychiatric:        Behavior: Behavior normal.      ED Treatments / Results  Labs (all labs ordered are listed, but only abnormal results are displayed) Labs Reviewed  GROUP A STREP BY PCR    EKG None  Radiology No results found.  Procedures Procedures (including critical care time)  Medications Ordered in ED Medications - No data to display   Initial Impression / Assessment and Plan / ED Course  I have reviewed the triage vital signs and the nursing notes.  Pertinent labs & imaging results that were available during my care of the patient were reviewed by me and considered in my medical decision making (see chart for details).        Patient presents to the emergency department for evaluation of sore throat for the last few days. He is non-toxic appearing on evaluation with no respiratory distress. Strep swab is negative. There is no clinical evidence of PTA, RPA, epiglotitis. Discussed with patient home care for viral pharyngitis. Discussed outpatient follow-up and return precautions.  Final Clinical Impressions(s) / ED Diagnoses   Final diagnoses:  Viral pharyngitis    ED Discharge Orders    None       Tilden Fossa, MD 11/14/18 [phone]

## 2020-01-15 ENCOUNTER — Other Ambulatory Visit: Payer: Self-pay

## 2020-01-15 ENCOUNTER — Emergency Department (HOSPITAL_BASED_OUTPATIENT_CLINIC_OR_DEPARTMENT_OTHER)
Admission: EM | Admit: 2020-01-15 | Discharge: 2020-01-15 | Disposition: A | Discharge status: Home / Self Care | Payer: Self-pay | Attending: Emergency Medicine | Admitting: Emergency Medicine

## 2020-01-15 ENCOUNTER — Encounter (HOSPITAL_BASED_OUTPATIENT_CLINIC_OR_DEPARTMENT_OTHER): Payer: Self-pay | Admitting: *Deleted

## 2020-01-15 DIAGNOSIS — R369 Urethral discharge, unspecified: Secondary | ICD-10-CM | POA: Insufficient documentation

## 2020-01-15 DIAGNOSIS — Z885 Allergy status to narcotic agent status: Secondary | ICD-10-CM | POA: Insufficient documentation

## 2020-01-15 LAB — URINALYSIS, ROUTINE W REFLEX MICROSCOPIC
Bilirubin Urine: NEGATIVE
Glucose, UA: NEGATIVE mg/dL
Hgb urine dipstick: NEGATIVE
Ketones, ur: NEGATIVE mg/dL
Leukocytes,Ua: NEGATIVE
Nitrite: NEGATIVE
Protein, ur: NEGATIVE mg/dL
Specific Gravity, Urine: >1.03 — ABNORMAL HIGH (ref 1.005–1.030)
pH: 6 (ref 5.0–8.0)

## 2020-01-15 MED ORDER — DOXYCYCLINE HYCLATE 100 MG PO CAPS: 100.0000 mg | ORAL_CAPSULE | Freq: Two times a day (BID) | ORAL | 0 refills | Status: AC

## 2020-01-15 MED ORDER — CEFTRIAXONE SODIUM 500 MG IJ SOLR
500.0000 mg | Freq: Once | INTRAMUSCULAR | Status: AC
  Administered 2020-01-15: 22:00:00 500 mg via INTRAMUSCULAR
  Filled 2020-01-15: qty 500

## 2020-01-15 MED ORDER — DOXYCYCLINE HYCLATE 100 MG PO TABS
100.0000 mg | ORAL_TABLET | Freq: Once | ORAL | Status: AC
Start: 2020-01-15 — End: 2020-01-15
  Administered 2020-01-15: 22:00:00 100 mg via ORAL
  Filled 2020-01-15: qty 1

## 2020-01-15 NOTE — ED Provider Notes (Signed)
[facility] EMERGENCY DEPARTMENT Provider Note   CSN: [id number] Arrival date & time: 01/15/20  1808     History Chief Complaint  Patient presents with  . Penile Discharge    Shawn Dillon is a 24 y.o. male.  HPI  Patient is 24 year old male with a history of gonorrhea/chlamydia in the past.  He is presented today for 1 day of penile discharge.  He denies any dysuria, frequency, urgency, penile pain testicular pain or nausea or vomiting fevers or chills.  States he otherwise feels well.  Denies any other associated symptoms.     Past Medical History:  Diagnosis Date  . Eczema   . Environmental and seasonal allergies   . Fracture, metacarpal    RIGHT 4TH AND 5TH FINGER   . Wears contact lenses     Patient Active Problem List   Diagnosis Date Noted  . Well child visit 11/13/2012  . Seasonal allergies 06/24/2011  . Asthma 06/24/2011  . Eczema 06/24/2011    Past Surgical History:  Procedure Laterality Date  . CLOSED REDUCTION FINGER WITH PERCUTANEOUS PINNING Right 01/29/2015   Procedure: RIGHT SMALL FINGER AND RIGHT RING FINGER CLOSED REDUCTION PINNING;  Surgeon: Bradly Bienenstock, MD;  Location: [facility];  Service: Orthopedics;  Laterality: Right;  . NO PAST SURGERIES         Family History  Problem Relation Age of Onset  . Cancer Brother   . Hypertension Brother   . Hypertension Father   . Hypertension Maternal Aunt   . Hypertension Maternal Grandmother   . Hypertension Maternal Grandfather   . Hypertension Paternal Grandmother   . Hypertension Paternal Grandfather     Social History   Tobacco Use  . Smoking status: Never Smoker  . Smokeless tobacco: Never Used  Substance Use Topics  . Alcohol use: No  . Drug use: Yes    Types: Marijuana    Home Medications Prior to Admission medications   Medication Sig Start Date End Date Taking? Authorizing Provider  Albuterol Sulfate (PROAIR RESPICLICK) 108 (90 Base) MCG/ACT AEPB Inhale  1-2 puffs into the lungs every 6 (six) hours as needed. 05/15/18   Mortis, Jerrel Ivory I, PA-C  Cetirizine HCl (ZYRTEC ALLERGY) 10 MG CAPS Take 1 capsule (10 mg total) by mouth daily. 01/25/18   Ward, Layla Maw, DO  fluticasone (FLONASE) 50 MCG/ACT nasal spray Place 2 sprays into both nostrils daily. 01/25/18   Ward, Layla Maw, DO  ibuprofen (ADVIL,MOTRIN) 800 MG tablet Take 1 tablet (800 mg total) by mouth 3 (three) times daily. 10/12/16   Elson Areas, PA-C    Allergies    Vicodin [hydrocodone-acetaminophen]  Review of Systems   Review of Systems  Constitutional: Negative for chills and fever.  HENT: Negative for congestion.   Respiratory: Negative for shortness of breath.   Cardiovascular: Negative for chest pain.  Gastrointestinal: Negative for abdominal pain.  Genitourinary: Positive for discharge. Negative for difficulty urinating, dysuria, enuresis, penile pain and penile swelling.  Musculoskeletal: Negative for neck pain.    Physical Exam Updated Vital Signs BP 112/76   Pulse 92   Temp 98.1 F (36.7 C)   Resp 18   Ht 5\' 9"  (1.753 m)   Wt 56.7 kg   SpO2 100%   BMI 18.46 kg/m   Physical Exam Vitals and nursing note reviewed. Exam conducted with a chaperone present.  Constitutional:      General: He is not in acute distress.    Appearance: Normal  appearance. He is not ill-appearing.  HENT:     Head: Normocephalic and atraumatic.  Eyes:     General: No scleral icterus.       Right eye: No discharge.        Left eye: No discharge.     Conjunctiva/sclera: Conjunctivae normal.  Pulmonary:     Effort: Pulmonary effort is normal.     Breath sounds: No stridor.  Abdominal:     Tenderness: There is no abdominal tenderness. There is no guarding.  Genitourinary:    Comments: Penis, scrotum, testes all normal.  No masses palpated in testes.  No tenderness to palpation. Skin:    General: Skin is warm and dry.  Neurological:     Mental Status: He is alert and oriented to  person, place, and time. Mental status is at baseline.     ED Results / Procedures / Treatments   Labs (all labs ordered are listed, but only abnormal results are displayed) Labs Reviewed  URINALYSIS, ROUTINE W REFLEX MICROSCOPIC - Abnormal; Notable for the following components:      Result Value   Specific Gravity, Urine >1.030 (*)    All other components within normal limits  GC/CHLAMYDIA PROBE AMP ([place]) NOT AT [facility]    EKG None  Radiology No results found.  Procedures Procedures (including critical care time)  Medications Ordered in ED Medications  cefTRIAXone (ROCEPHIN) injection 500 mg (has no administration in time range)  doxycycline (VIBRA-TABS) tablet 100 mg (has no administration in time range)    ED Course  I have reviewed the triage vital signs and the nursing notes.  Pertinent labs & imaging results that were available during my care of the patient were reviewed by me and considered in my medical decision making (see chart for details).    MDM Rules/Calculators/A&P                      Patient is well-appearing 24 year old male with no significant past medical history apart from prior STDs.  Patient presenting with penile discharge that started today.  No other symptoms.  Physical exam is unremarkable.  UA is without any acute abnormality.  GC chlamydia probe collected. Patient treated with Rocephin 500 mg IM; will take doxycycline for 1 week.  Patient provided with first dose here in the ED.  He will follow-up at the health department for STI testing in future including HIV and RPR.   Final Clinical Impression(s) / ED Diagnoses Final diagnoses:  Penile discharge    Rx / DC Orders ED Discharge Orders    None       Tedd Sias, [place] 01/15/20 2159    Fredia Sorrow, MD 01/18/20 [phone]

## 2020-01-15 NOTE — ED Triage Notes (Signed)
Penis discharge x 1 day 

## 2020-01-15 NOTE — Discharge Instructions (Addendum)
You have been treated in the emergency department for an infection, possibly sexually transmitted. Results of your gonorrhea and chlamydia tests are pending and you will be notified if they are positive. Please refrain from intercourse for 7 days and until all sex partners (within previous 60 days) are evaluated and/or treated as well. Please follow up with your primary care provider for continued care and further STD evaluation.  It is very important to practice safe sex and use condoms when sexually active. If your results are positive you need to notify all sexual partners so they can be treated as well. The website http://www.dontspreadit.com/ can be used to send anonymous text messages or emails to alert sexual contacts. Follow up with your doctor, or OBGYN in regards to today's visit.   Gonorrhea and Chlamydia SYMPTOMS  In females, symptoms may go unnoticed. Symptoms that are more noticeable can include:  Belly (abdominal) pain.  Painful intercourse.  Watery mucous-like discharge from the vagina.  Miscarriage.  Discomfort when urinating.  Inflammation of the rectum.  Abnormal gray-green frothy vaginal discharge  Vaginal itching and irritatio  Itching and irritation of the area outside the vagina.   Painful urination.  Bleeding after sexual intercourse.  In males, symptoms include:  Burning with urination.  Pain in the testicles.  Watery mucous-like discharge from the penis.  It can cause longstanding (chronic) pelvic pain after frequent infections.  TREATMENT  PID can cause women to not be able to have children (sterile) if left untreated or if half-treated.  It is important to finish ALL medications given to you.  This is a sexually transmitted infection. So you are also at risk for other sexually transmitted diseases, including HIV (AIDS), it is recommended that you get tested. HOME CARE INSTRUCTIONS  Warning: This infection is contagious. Do not have sex until treatment is  completed. Follow up at your caregiver's office or the clinic to which you were referred. If your diagnosis (learning what is wrong) is confirmed by culture or some other method, your recent sexual contacts need treatment. Even if they are symptom free or have a negative culture or evaluation, they should be treated.  PREVENTION  Women should use sanitary pads instead of tampons for vaginal discharge.  Wipe front to back after using the toilet and avoid douching.   Practice safe sex, use condoms, have only one sex partner and be sure your sex partner is not having sex with others.  Ask your caregiver to test you for chlamydia at your regular checkups or sooner if you are having symptoms.  Ask for further information if you are pregnant.  SEEK IMMEDIATE MEDICAL CARE IF:  You develop an oral temperature above 102 F (38.9 C), not controlled by medications or lasting more than 2 days.  You develop an increase in pain.  You develop any type of abnormal discharge.  You develop vaginal bleeding and it is not time for your period.  You develop painful intercourse.   Bacterial Vaginosis  Bacterial vaginosis (BV) is a vaginal infection where the normal balance of bacteria in the vagina is disrupted. This is not a sexually transmitted disease and your sexual partners do NOT need to be treated. CAUSES  The cause of BV is not fully understood. BV develops when there is an increase or imbalance of harmful bacteria.  Some activities or behaviors can upset the normal balance of bacteria in the vagina and put women at increased risk including:  Having a new sex partner or multiple   sex partners.  Douching.  Using an intrauterine device (IUD) for contraception.  It is not clear what role sexual activity plays in the development of BV. However, women that have never had sexual intercourse are rarely infected with BV.  Women do not get BV from toilet seats, bedding, swimming pools or from touching objects around  them.   SYMPTOMS  Grey vaginal discharge.  A fish-like odor with discharge, especially after sexual intercourse.  Itching or burning of the vagina and vulva.  Burning or pain with urination.  Some women have no signs or symptoms at all.   TREATMENT  Sometimes BV will clear up without treatment.  BV may be treated with antibiotics.  BV can recur after treatment. If this happens, a second round of antibiotics will often be prescribed.  HOME CARE INSTRUCTIONS  Finish all medication as directed by your caregiver.  Do not have sex until treatment is completed.  Do NOT drink any alcoholic beverages while being treated  with Metronidazole (Flagyl). This will cause a severe reaction inducing vomiting.  RESOURCE GUIDE  Dental Problems  Patients with Medicaid: [facility]                     [facility] [address]                                           [address] Phone:  [phone]                                                  Phone:  [phone]  If unable to pay or uninsured, contact:  [facility] or [facility] to become qualified for the adult dental clinic.  Chronic Pain Problems Contact [facility]  [phone] Patients need to be referred by their primary care doctor.  Insufficient Money for Medicine Contact [employer]:  call "211" or [facility] 271-5999.  No Primary Care Doctor Call [facility]  [phone] Other agencies that provide inexpensive medical care    [facility]  [phone]     Internal Medicine  [phone]    [facility]  [phone]    Women's Clinic  [phone]    [facility]  [phone]    [facility]  [phone]  Psychological Services [facility]  [phone] [facility]  [phone] [facility]   800 853-5163 (emergency services 641-4993)  Substance Abuse Resources [facility]   336-882-2125 [facility] 336-784-9470 [facility] 336-285-9073 [facility] 336-845-3988 Residential & Outpatient Substance Abuse Program  800-659-3381  Abuse/Neglect [place] Child Abuse Hotline (336) 641-3795 [place] Child Abuse Hotline 800-378-5315 (After Hours)  Emergency Shelter [facility] (336) 271-5985  [facility] (336) 275-9566 [facility] (704) 372-4663  MRSA Hotline #:   832-7006    [place] Resources  [facility] [place]     [employer]                          [facility] [address]   [place]                       [address]      [address]                                                  [place]                            [place] Phone:  [phone]                                   Phone:  [phone]                 Phone:  [phone]  [facility] Phone:  [phone]  [place] Child Abuse Hotline (336) 342-1394 (336) 342-3537 (After Hours)   

## 2020-01-17 LAB — GC/CHLAMYDIA PROBE AMP (~~LOC~~) NOT AT ARMC
Chlamydia: NEGATIVE
Comment: NEGATIVE
Comment: NORMAL
Neisseria Gonorrhea: NEGATIVE

## 2021-01-12 ENCOUNTER — Emergency Department (HOSPITAL_BASED_OUTPATIENT_CLINIC_OR_DEPARTMENT_OTHER)
Admission: EM | Admit: 2021-01-12 | Discharge: 2021-01-12 | Disposition: A | Discharge status: Home / Self Care | Payer: Self-pay | Attending: Emergency Medicine | Admitting: Emergency Medicine

## 2021-01-12 ENCOUNTER — Other Ambulatory Visit: Payer: Self-pay

## 2021-01-12 ENCOUNTER — Encounter (HOSPITAL_BASED_OUTPATIENT_CLINIC_OR_DEPARTMENT_OTHER): Payer: Self-pay

## 2021-01-12 DIAGNOSIS — Z7951 Long term (current) use of inhaled steroids: Secondary | ICD-10-CM | POA: Insufficient documentation

## 2021-01-12 DIAGNOSIS — R369 Urethral discharge, unspecified: Secondary | ICD-10-CM | POA: Insufficient documentation

## 2021-01-12 DIAGNOSIS — J45909 Unspecified asthma, uncomplicated: Secondary | ICD-10-CM | POA: Insufficient documentation

## 2021-01-12 LAB — URINALYSIS, MICROSCOPIC (REFLEX): RBC / HPF: NONE SEEN RBC/hpf (ref 0–5)

## 2021-01-12 LAB — URINALYSIS, ROUTINE W REFLEX MICROSCOPIC
Bilirubin Urine: NEGATIVE
Glucose, UA: NEGATIVE mg/dL
Hgb urine dipstick: NEGATIVE
Ketones, ur: NEGATIVE mg/dL
Nitrite: NEGATIVE
Protein, ur: NEGATIVE mg/dL
Specific Gravity, Urine: 1.015 (ref 1.005–1.030)
pH: 7 (ref 5.0–8.0)

## 2021-01-12 MED ORDER — CEFTRIAXONE SODIUM 500 MG IJ SOLR
500.0000 mg | Freq: Once | INTRAMUSCULAR | Status: AC
  Administered 2021-01-12: 500 mg via INTRAMUSCULAR
  Filled 2021-01-12: qty 500

## 2021-01-12 MED ORDER — DOXYCYCLINE HYCLATE 100 MG PO CAPS: 100.0000 mg | ORAL_CAPSULE | Freq: Two times a day (BID) | ORAL | 0 refills | Status: AC

## 2021-01-12 MED ORDER — DOXYCYCLINE HYCLATE 100 MG PO TABS
100.0000 mg | ORAL_TABLET | Freq: Once | ORAL | Status: AC
  Administered 2021-01-12: 100 mg via ORAL
  Filled 2021-01-12: qty 1

## 2021-01-12 NOTE — ED Provider Notes (Signed)
[facility] EMERGENCY DEPARTMENT Provider Note   CSN: [id number] Arrival date & time: 01/12/21  1945     History Chief Complaint  Patient presents with  . Penile Discharge    Shawn Dillon is a 25 y.o. male with no relevant past medical history presents the ED with complaints of penile discharge.  I reviewed his medical record and he was seen on 01/15/2020 for same complaints.  He was treated empirically with Rocephin and doxycycline, but cultures came back negative for chlamydia or gonorrhea.  He was encouraged to follow-up with the health department for STI testing moving forward.  On my examination, patient reports that for the past 2 days he has been expressing whitish penile discharge.  He states that he has been with 1 partner, most recent sexual intercourse was 1 week ago.  He did not use condom or other protection.    He denies any testicular pain, swelling, rash, abdominal pain, pain with defecation, fevers or chills, or other symptoms.  HPI     Past Medical History:  Diagnosis Date  . Eczema   . Environmental and seasonal allergies   . Fracture, metacarpal    RIGHT 4TH AND 5TH FINGER   . Wears contact lenses     Patient Active Problem List   Diagnosis Date Noted  . Well child visit 11/13/2012  . Seasonal allergies 06/24/2011  . Asthma 06/24/2011  . Eczema 06/24/2011    Past Surgical History:  Procedure Laterality Date  . CLOSED REDUCTION FINGER WITH PERCUTANEOUS PINNING Right 01/29/2015   Procedure: RIGHT SMALL FINGER AND RIGHT RING FINGER CLOSED REDUCTION PINNING;  Surgeon: Bradly Bienenstock, MD;  Location: [facility];  Service: Orthopedics;  Laterality: Right;  . NO PAST SURGERIES         Family History  Problem Relation Age of Onset  . Cancer Brother   . Hypertension Brother   . Hypertension Father   . Hypertension Maternal Aunt   . Hypertension Maternal Grandmother   . Hypertension Maternal Grandfather   . Hypertension  Paternal Grandmother   . Hypertension Paternal Grandfather     Social History   Tobacco Use  . Smoking status: Never Smoker  . Smokeless tobacco: Never Used  Vaping Use  . Vaping Use: Never used  Substance Use Topics  . Alcohol use: No  . Drug use: Yes    Types: Marijuana    Home Medications Prior to Admission medications   Medication Sig Start Date End Date Taking? Authorizing Provider  doxycycline (VIBRAMYCIN) 100 MG capsule Take 1 capsule (100 mg total) by mouth 2 (two) times daily for 7 days. 01/12/21 01/19/21 Yes Lorelee New, PA-C  Albuterol Sulfate (PROAIR RESPICLICK) 108 (90 Base) MCG/ACT AEPB Inhale 1-2 puffs into the lungs every 6 (six) hours as needed. 05/15/18   Mortis, Jerrel Ivory I, PA-C  Cetirizine HCl (ZYRTEC ALLERGY) 10 MG CAPS Take 1 capsule (10 mg total) by mouth daily. 01/25/18   Ward, Layla Maw, DO  fluticasone (FLONASE) 50 MCG/ACT nasal spray Place 2 sprays into both nostrils daily. 01/25/18   Ward, Layla Maw, DO  ibuprofen (ADVIL,MOTRIN) 800 MG tablet Take 1 tablet (800 mg total) by mouth 3 (three) times daily. 10/12/16   Elson Areas, PA-C    Allergies    Vicodin [hydrocodone-acetaminophen]  Review of Systems   Review of Systems  All other systems reviewed and are negative.   Physical Exam Updated Vital Signs BP 140/82 (BP Location: Left Arm)  Pulse 72   Temp 97.9 F (36.6 C) (Oral)   Resp 18   Ht 5\' 9"  (1.753 m)   Wt 56.7 kg   SpO2 100%   BMI 18.46 kg/m   Physical Exam  ED Results / Procedures / Treatments   Labs (all labs ordered are listed, but only abnormal results are displayed) Labs Reviewed  URINALYSIS, ROUTINE W REFLEX MICROSCOPIC  GC/CHLAMYDIA PROBE AMP ([place]) NOT AT [facility]    EKG None  Radiology No results found.  Procedures Procedures   Medications Ordered in ED Medications  doxycycline (VIBRA-TABS) tablet 100 mg (100 mg Oral Given 01/12/21 2044)  cefTRIAXone (ROCEPHIN) injection 500 mg (500 mg Intramuscular  Given 01/12/21 2044)    ED Course  I have reviewed the triage vital signs and the nursing notes.  Pertinent labs & imaging results that were available during my care of the patient were reviewed by me and considered in my medical decision making (see chart for details).    MDM Rules/Calculators/A&P                          Shawn Dillon was evaluated in Emergency Department on 01/12/2021 for the symptoms described in the history of present illness. He was evaluated in the context of the global COVID-19 pandemic, which necessitated consideration that the patient might be at risk for infection with the SARS-CoV-2 virus that causes COVID-19. Institutional protocols and algorithms that pertain to the evaluation of patients at risk for COVID-19 are in a state of rapid change based on information released by regulatory bodies including the CDC and federal and state organizations. These policies and algorithms were followed during the patient's care in the ED.  I personally reviewed patient's medical chart and all notes from triage and staff during today's encounter. I have also ordered and reviewed all labs and imaging that I felt to be medically necessary in the evaluation of this patient's complaints and with consideration of their physical exam. If needed, translation services were available and utilized.   Patient is afebrile without abdominal tenderness, abdominal pain or painful bowel movements to indicate prostatitis.  No tenderness to palpation of the testes or epididymis to suggest orchitis or epididymitis.  STD cultures obtained including gonorrhea and chlamydia. Patient to be discharged with instructions to follow up with PCP. Discussed importance of using protection when sexually active. Pt understands that they have GC/Chlamydia cultures pending and that they will need to inform all sexual partners if results return positive. Patient has been treated prophylactically with Rocephin and prescribed  doxycycline.    Final Clinical Impression(s) / ED Diagnoses Final diagnoses:  Penile discharge    Rx / DC Orders ED Discharge Orders         Ordered    doxycycline (VIBRAMYCIN) 100 MG capsule  2 times daily        01/12/21 2037           2038, PA-C 01/12/21 2056    2057, MD 01/12/21 2243

## 2021-01-12 NOTE — ED Triage Notes (Signed)
Pt c/o penile d/c x 2-3 days-NAD-steady gait

## 2021-01-12 NOTE — Discharge Instructions (Addendum)
You have been treated presumptively today for gonorrhea and you have been prescribed medication to cover for chlamydia.  Please take your antibiotic, as prescribed.  Take with food.  You have been tested today for gonorrhea and chlamydia. These results will be available in approximately 3 days. You may check your MyChart account for results. Please inform all sexual partners of positive results and that they should be tested and treated as well.  Please wait 2 weeks and be sure that you and your partners are symptom free before returning to sexual activity. Please use protection with every sexual encounter.  Follow Up: Please followup with your primary doctor in 3 days for discussion of your diagnoses and further evaluation after today's visit; if you do not have a primary care doctor use the resource guide provided to find one; Please return to the ER for worsening symptoms, high fevers or persistent vomiting.   [place] [facility] of [facility] - STI screening Address: [address] [place], [place] [address] Hours:  Closed ? [place] Wed Phone: [phone]

## 2021-01-13 LAB — GC/CHLAMYDIA PROBE AMP (~~LOC~~) NOT AT ARMC
Chlamydia: NEGATIVE
Comment: NEGATIVE
Comment: NORMAL
Neisseria Gonorrhea: POSITIVE — AB

## 2021-04-01 ENCOUNTER — Other Ambulatory Visit: Payer: Self-pay

## 2021-04-01 ENCOUNTER — Encounter (HOSPITAL_BASED_OUTPATIENT_CLINIC_OR_DEPARTMENT_OTHER): Payer: Self-pay | Admitting: Emergency Medicine

## 2021-04-01 ENCOUNTER — Other Ambulatory Visit (HOSPITAL_BASED_OUTPATIENT_CLINIC_OR_DEPARTMENT_OTHER): Payer: Self-pay

## 2021-04-01 ENCOUNTER — Emergency Department (HOSPITAL_BASED_OUTPATIENT_CLINIC_OR_DEPARTMENT_OTHER)
Admission: EM | Admit: 2021-04-01 | Discharge: 2021-04-01 | Disposition: A | Discharge status: Home / Self Care | Payer: Self-pay | Attending: Emergency Medicine | Admitting: Emergency Medicine

## 2021-04-01 ENCOUNTER — Emergency Department (HOSPITAL_BASED_OUTPATIENT_CLINIC_OR_DEPARTMENT_OTHER): Payer: Self-pay

## 2021-04-01 DIAGNOSIS — R059 Cough, unspecified: Secondary | ICD-10-CM

## 2021-04-01 DIAGNOSIS — Z28310 Unvaccinated for covid-19: Secondary | ICD-10-CM | POA: Insufficient documentation

## 2021-04-01 DIAGNOSIS — U071 COVID-19: Secondary | ICD-10-CM | POA: Insufficient documentation

## 2021-04-01 DIAGNOSIS — J45909 Unspecified asthma, uncomplicated: Secondary | ICD-10-CM | POA: Insufficient documentation

## 2021-04-01 DIAGNOSIS — R000 Tachycardia, unspecified: Secondary | ICD-10-CM | POA: Insufficient documentation

## 2021-04-01 DIAGNOSIS — J189 Pneumonia, unspecified organism: Secondary | ICD-10-CM

## 2021-04-01 LAB — CBC WITH DIFFERENTIAL/PLATELET
Abs Immature Granulocytes: 0.04 10*3/uL (ref 0.00–0.07)
Basophils Absolute: 0 10*3/uL (ref 0.0–0.1)
Basophils Relative: 0 %
Eosinophils Absolute: 0.5 10*3/uL (ref 0.0–0.5)
Eosinophils Relative: 5 %
HCT: 39.1 % (ref 39.0–52.0)
Hemoglobin: 13.1 g/dL (ref 13.0–17.0)
Immature Granulocytes: 0 %
Lymphocytes Relative: 22 %
Lymphs Abs: 2.1 10*3/uL (ref 0.7–4.0)
MCH: 30.7 pg (ref 26.0–34.0)
MCHC: 33.5 g/dL (ref 30.0–36.0)
MCV: 91.6 fL (ref 80.0–100.0)
Monocytes Absolute: 0.9 10*3/uL (ref 0.1–1.0)
Monocytes Relative: 9 %
Neutro Abs: 6.1 10*3/uL (ref 1.7–7.7)
Neutrophils Relative %: 64 %
Platelets: 240 10*3/uL (ref 150–400)
RBC: 4.27 MIL/uL (ref 4.22–5.81)
RDW: 12.6 % (ref 11.5–15.5)
WBC: 9.7 10*3/uL (ref 4.0–10.5)
nRBC: 0 % (ref 0.0–0.2)

## 2021-04-01 LAB — COMPREHENSIVE METABOLIC PANEL
ALT: 12 U/L (ref 0–44)
AST: 16 U/L (ref 15–41)
Albumin: 4.5 g/dL (ref 3.5–5.0)
Alkaline Phosphatase: 72 U/L (ref 38–126)
Anion gap: 9 (ref 5–15)
BUN: 11 mg/dL (ref 6–20)
CO2: 26 mmol/L (ref 22–32)
Calcium: 10.2 mg/dL (ref 8.9–10.3)
Chloride: 100 mmol/L (ref 98–111)
Creatinine, Ser: 1.05 mg/dL (ref 0.61–1.24)
GFR, Estimated: >60 mL/min (ref >60)
Glucose, Bld: 115 mg/dL — ABNORMAL HIGH (ref 70–99)
Potassium: 3.4 mmol/L — ABNORMAL LOW (ref 3.5–5.1)
Sodium: 135 mmol/L (ref 135–145)
Total Bilirubin: 0.8 mg/dL (ref 0.3–1.2)
Total Protein: 7.5 g/dL (ref 6.5–8.1)

## 2021-04-01 LAB — RESP PANEL BY RT-PCR (FLU A&B, COVID) ARPGX2
Influenza A by PCR: NEGATIVE
Influenza B by PCR: NEGATIVE
SARS Coronavirus 2 by RT PCR: POSITIVE — AB

## 2021-04-01 MED ORDER — BENZONATATE 100 MG PO CAPS
100.0000 mg | ORAL_CAPSULE | Freq: Three times a day (TID) | ORAL | 0 refills | Status: DC
  Filled 2021-04-01: qty 21, 7d supply, fill #0

## 2021-04-01 MED ORDER — METHYLPREDNISOLONE SODIUM SUCC 125 MG IJ SOLR: 125.0000 mg | Freq: Once | INTRAMUSCULAR | Status: DC

## 2021-04-01 MED ORDER — ALBUTEROL SULFATE HFA 108 (90 BASE) MCG/ACT IN AERS
2.0000 | INHALATION_SPRAY | Freq: Once | RESPIRATORY_TRACT | Status: AC
  Administered 2021-04-01: 2 via RESPIRATORY_TRACT
  Filled 2021-04-01: qty 6.7

## 2021-04-01 MED ORDER — PREDNISONE 50 MG PO TABS
60.0000 mg | ORAL_TABLET | Freq: Once | ORAL | Status: AC
  Administered 2021-04-01: 60 mg via ORAL
  Filled 2021-04-01: qty 1

## 2021-04-01 MED ORDER — DOXYCYCLINE HYCLATE 100 MG PO CAPS
100.0000 mg | ORAL_CAPSULE | Freq: Two times a day (BID) | ORAL | 0 refills | Status: DC
  Filled 2021-04-01: qty 20, 10d supply, fill #0

## 2021-04-01 NOTE — ED Provider Notes (Signed)
[facility] EMERGENCY DEPARTMENT Provider Note   CSN: [id number] Arrival date & time: 04/01/21  1036     History Chief Complaint  Patient presents with   Cough    Shawn Dillon is a 25 y.o. male.  HPI  Patient presents with 1 week of cough, headache, congestion, mucus production.  He has not tried any over-the-counter medicine.  States symptoms are worse at night.  He also reports subjective fever and chills.  He had COVID before, he has not vaccinated against it.  No known exposures.  He has a history of asthma but has not used his inhaler in many years.  Past Medical History:  Diagnosis Date   Eczema    Environmental and seasonal allergies    Fracture, metacarpal    RIGHT 4TH AND 5TH FINGER    Wears contact lenses     Patient Active Problem List   Diagnosis Date Noted   Well child visit 11/13/2012   Seasonal allergies 06/24/2011   Asthma 06/24/2011   Eczema 06/24/2011    Past Surgical History:  Procedure Laterality Date   CLOSED REDUCTION FINGER WITH PERCUTANEOUS PINNING Right 01/29/2015   Procedure: RIGHT SMALL FINGER AND RIGHT RING FINGER CLOSED REDUCTION PINNING;  Surgeon: Bradly Bienenstock, MD;  Location: [facility];  Service: Orthopedics;  Laterality: Right;   NO PAST SURGERIES         Family History  Problem Relation Age of Onset   Cancer Brother    Hypertension Brother    Hypertension Father    Hypertension Maternal Aunt    Hypertension Maternal Grandmother    Hypertension Maternal Grandfather    Hypertension Paternal Grandmother    Hypertension Paternal Grandfather     Social History   Tobacco Use   Smoking status: Never   Smokeless tobacco: Never  Vaping Use   Vaping Use: Never used  Substance Use Topics   Alcohol use: No   Drug use: Yes    Types: Marijuana    Home Medications Prior to Admission medications   Medication Sig Start Date End Date Taking? Authorizing Provider  Albuterol Sulfate (PROAIR RESPICLICK) 108  (90 Base) MCG/ACT AEPB Inhale 1-2 puffs into the lungs every 6 (six) hours as needed. 05/15/18   Mortis, Jerrel Ivory I, PA-C  Cetirizine HCl (ZYRTEC ALLERGY) 10 MG CAPS Take 1 capsule (10 mg total) by mouth daily. 01/25/18   Ward, Layla Maw, DO  fluticasone (FLONASE) 50 MCG/ACT nasal spray Place 2 sprays into both nostrils daily. 01/25/18   Ward, Layla Maw, DO  ibuprofen (ADVIL,MOTRIN) 800 MG tablet Take 1 tablet (800 mg total) by mouth 3 (three) times daily. 10/12/16   Elson Areas, PA-C    Allergies    Vicodin [hydrocodone-acetaminophen]  Review of Systems   Review of Systems  Constitutional:  Positive for chills and fever. Negative for appetite change.  HENT:  Positive for congestion. Negative for sneezing and sore throat.   Respiratory:  Positive for cough.   Neurological:  Positive for headaches.   Physical Exam Updated Vital Signs BP 119/82 (BP Location: Right Arm)   Pulse (!) 108   Temp 97.8 F (36.6 C) (Oral)   Resp 20   Ht 5\' 8"  (1.727 m)   Wt 52.6 kg   SpO2 100%   BMI 17.64 kg/m   Physical Exam Vitals and nursing note reviewed. Exam conducted with a chaperone present.  Constitutional:      Appearance: Normal appearance.  HENT:  Head: Normocephalic and atraumatic.  Eyes:     General: No scleral icterus.       Right eye: No discharge.        Left eye: No discharge.     Extraocular Movements: Extraocular movements intact.     Pupils: Pupils are equal, round, and reactive to light.  Cardiovascular:     Rate and Rhythm: Regular rhythm. Tachycardia present.     Pulses: Normal pulses.     Heart sounds: Normal heart sounds. No murmur heard.   No friction rub. No gallop.  Pulmonary:     Effort: Pulmonary effort is normal. No respiratory distress.     Breath sounds: Wheezing and rales present.     Comments: Patient has crackles in the upper lung fields.  I also has crackles in the lower fields, as well as inspiratory wheezes. Abdominal:     General: Abdomen is flat.  Bowel sounds are normal. There is no distension.     Palpations: Abdomen is soft.     Tenderness: There is no abdominal tenderness.  Skin:    General: Skin is warm and dry.     Coloration: Skin is not jaundiced.  Neurological:     Mental Status: He is alert. Mental status is at baseline.     Coordination: Coordination normal.    ED Results / Procedures / Treatments   Labs (all labs ordered are listed, but only abnormal results are displayed) Labs Reviewed  RESP PANEL BY RT-PCR (FLU A&B, COVID) [id number]  COMPREHENSIVE METABOLIC PANEL  CBC WITH DIFFERENTIAL/PLATELET    EKG None  Radiology No results found.  Procedures Procedures   Medications Ordered in ED Medications - No data to display  ED Course  I have reviewed the triage vital signs and the nursing notes.  Pertinent labs & imaging results that were available during my care of the patient were reviewed by me and considered in my medical decision making (see chart for details).  Clinical Course as of 04/01/21 1210  Thu Apr 01, 2021  1146 CBC with Differential No anemia, no elevated WBC concerning for infectious or inflammatory process  [HS]  1150 Comprehensive metabolic panel(!) No electrolyte derangement, AKI or LFT elevation. Reassuring overall.  [HS]    Clinical Course User Index [HS] Theron Arista, PA-C   MDM Rules/Calculators/A&P                          Patient is mildly tachycardic on exam.  He does have wheezes and rales throughout his lung fields.  I will give him Solu-Medrol and an asthma inhaler and reassess.  We will also order chest x-ray to evaluate for possible pneumonia.  COVID test to assess for possible COVID, he has had it recently but it is still a possibility.  Reevaluation: Patient lung sounds still have Rales diffusely throughout.  He says he feels about the same as before the treatment.  I doubt this is related to asthma.  Patient is COVID-positive with a left lower lobe pneumonia.  I will  start him on outpatient antibiotics for the pneumonia and recommend symptomatic management for the COVID.  It is been more than a week so he is not a candidate for Paxlovid.   Final Clinical Impression(s) / ED Diagnoses Final diagnoses:  None    Rx / DC Orders ED Discharge Orders     None        Theron Arista, [place] 04/01/21 1211  Virgina Norfolk, DO 04/01/21 Rickey Primus

## 2021-04-01 NOTE — Discharge Instructions (Addendum)
You tested positive for COVID-19.  Please take Tylenol as needed for fever and body aches.  You can use Tessalon Perles every 8 hours as needed for cough.  You also tested positive for pneumonia.  Please take doxycycline twice daily for the next 10 days.  This will help clear the pneumonia.  Continue to wear your mask in public.  You may take a few weeks for you to feel better.  You develop significant shortness of breath  or if things change or worsen please return back to the ED for further evaluation.

## 2021-04-01 NOTE — ED Triage Notes (Signed)
Patient complaining of cough, headache, congestion, mucus production. Has not attempted otc medications.

## 2022-01-25 ENCOUNTER — Other Ambulatory Visit (HOSPITAL_COMMUNITY): Payer: Self-pay

## 2022-07-09 ENCOUNTER — Other Ambulatory Visit: Payer: Self-pay

## 2022-07-09 ENCOUNTER — Emergency Department (HOSPITAL_BASED_OUTPATIENT_CLINIC_OR_DEPARTMENT_OTHER)
Admission: EM | Admit: 2022-07-09 | Discharge: 2022-07-09 | Disposition: A | Discharge status: Home / Self Care | Payer: Self-pay | Attending: Emergency Medicine | Admitting: Emergency Medicine

## 2022-07-09 DIAGNOSIS — N342 Other urethritis: Secondary | ICD-10-CM | POA: Insufficient documentation

## 2022-07-09 LAB — URINALYSIS, ROUTINE W REFLEX MICROSCOPIC
Bilirubin Urine: NEGATIVE
Glucose, UA: NEGATIVE mg/dL
Hgb urine dipstick: NEGATIVE
Ketones, ur: NEGATIVE mg/dL
Nitrite: NEGATIVE
Protein, ur: NEGATIVE mg/dL
Specific Gravity, Urine: >=1.03 (ref 1.005–1.030)
pH: 6 (ref 5.0–8.0)

## 2022-07-09 LAB — URINALYSIS, MICROSCOPIC (REFLEX): WBC, UA: >50 WBC/hpf (ref 0–5)

## 2022-07-09 MED ORDER — CEFTRIAXONE SODIUM 500 MG IJ SOLR
500.0000 mg | Freq: Once | INTRAMUSCULAR | Status: AC
  Administered 2022-07-09: 500 mg via INTRAMUSCULAR
  Filled 2022-07-09: qty 500

## 2022-07-09 MED ORDER — AZITHROMYCIN 250 MG PO TABS
1000.0000 mg | ORAL_TABLET | Freq: Once | ORAL | Status: AC
  Administered 2022-07-09: 1000 mg via ORAL
  Filled 2022-07-09: qty 4

## 2022-07-09 NOTE — ED Provider Notes (Signed)
[place] EMERGENCY DEPARTMENT Provider Note   CSN: [id number] Arrival date & time: 07/09/22  0308     History  Chief Complaint  Patient presents with   Dysuria   Penile Discharge    Shawn Dillon is a 26 y.o. male.  The history is provided by the patient and medical records.  Dysuria Presenting symptoms: dysuria and penile discharge   Penile Discharge  Shawn Dillon is a 26 y.o. male who presents to the Emergency Department complaining of dysuria with penile discharge since Monday.  He reports small amount of yellow discharge.  He had a similar episode 1 year ago related to gonorrhea.  He was just recently with a partner that previously gave him gonorrhea 1 year ago.  He was not using protection.  No fevers, nausea, vomiting, abdominal pain, skin rash.  He has no known medical problems and takes no medications.        Home Medications Prior to Admission medications   Medication Sig Start Date End Date Taking? Authorizing Provider  Albuterol Sulfate (PROAIR RESPICLICK) 937 (90 Base) MCG/ACT AEPB Inhale 1-2 puffs into the lungs every 6 (six) hours as needed. 05/15/18   Mortis, Alvie Heidelberg I, PA-C  benzonatate (TESSALON) 100 MG capsule Take 1 capsule (100 mg total) by mouth every 8 (eight) hours. 04/01/21   Sherrill Raring, PA-C  Cetirizine HCl (ZYRTEC ALLERGY) 10 MG CAPS Take 1 capsule (10 mg total) by mouth daily. 01/25/18   Ward, Delice Bison, DO  doxycycline (VIBRAMYCIN) 100 MG capsule Take 1 capsule (100 mg total) by mouth 2 (two) times daily. 04/01/21   Sherrill Raring, PA-C  fluticasone (FLONASE) 50 MCG/ACT nasal spray Place 2 sprays into both nostrils daily. 01/25/18   Ward, Delice Bison, DO  ibuprofen (ADVIL,MOTRIN) 800 MG tablet Take 1 tablet (800 mg total) by mouth 3 (three) times daily. 10/12/16   Fransico Meadow, PA-C      Allergies    Vicodin [hydrocodone-acetaminophen]    Review of Systems   Review of Systems  Genitourinary:  Positive for dysuria and penile discharge.   All other systems reviewed and are negative.   Physical Exam Updated Vital Signs BP 119/69 (BP Location: Left Arm)   Pulse 60   Temp 97.7 F (36.5 C)   Resp 18   SpO2 100%  Physical Exam Vitals and nursing note reviewed.  Constitutional:      Appearance: He is well-developed.  HENT:     Head: Normocephalic and atraumatic.  Cardiovascular:     Rate and Rhythm: Normal rate and regular rhythm.  Pulmonary:     Effort: Pulmonary effort is normal. No respiratory distress.  Abdominal:     Palpations: Abdomen is soft.     Tenderness: There is no abdominal tenderness. There is no guarding or rebound.  Genitourinary:    Comments: Deferred Musculoskeletal:        General: No tenderness.  Skin:    General: Skin is warm and dry.  Neurological:     Mental Status: He is alert and oriented to person, place, and time.  Psychiatric:        Behavior: Behavior normal.     ED Results / Procedures / Treatments   Labs (all labs ordered are listed, but only abnormal results are displayed) Labs Reviewed  URINALYSIS, ROUTINE W REFLEX MICROSCOPIC - Abnormal; Notable for the following components:      Result Value   APPearance CLOUDY (*)    Leukocytes,Ua MODERATE (*)  All other components within normal limits  URINALYSIS, MICROSCOPIC (REFLEX) - Abnormal; Notable for the following components:   Bacteria, UA FEW (*)    All other components within normal limits  URINE CULTURE  GC/CHLAMYDIA PROBE AMP ([place]) NOT AT [facility]    EKG None  Radiology No results found.  Procedures Procedures    Medications Ordered in ED Medications  cefTRIAXone (ROCEPHIN) injection 500 mg (has no administration in time range)  azithromycin (ZITHROMAX) tablet 1,000 mg (has no administration in time range)    ED Course/ Medical Decision Making/ A&P                           Medical Decision Making Amount and/or Complexity of Data Reviewed Labs: ordered.  Risk Prescription drug  management.   Patient here for evaluation of dysuria and penile discharge, similar episode in the past secondary to gonorrhea.  He is nontoxic-appearing on evaluation and in no acute distress.  Abdominal examination is benign.  UA with numerous WBC present.  Will treat empirically for recurrent STI given his symptoms.  Discussed recommendation by CDC for HIV and syphilis testing, patient declines at this time.  We will also send urine culture given UA findings.  Discussed infection control practices, return precautions.        Final Clinical Impression(s) / ED Diagnoses Final diagnoses:  Urethritis    Rx / DC Orders ED Discharge Orders     None         Tilden Fossa, MD 07/09/22 [phone]

## 2022-07-09 NOTE — ED Triage Notes (Signed)
Pt reports dysuria and penile discharge since Monday. Pt concerned for STD

## 2022-07-10 LAB — URINE CULTURE: Culture: <10000 — AB

## 2022-07-11 LAB — GC/CHLAMYDIA PROBE AMP (~~LOC~~) NOT AT ARMC
Chlamydia: POSITIVE — AB
Comment: NEGATIVE
Comment: NORMAL
Neisseria Gonorrhea: POSITIVE — AB

## 2022-12-17 ENCOUNTER — Emergency Department (HOSPITAL_BASED_OUTPATIENT_CLINIC_OR_DEPARTMENT_OTHER)
Admission: EM | Admit: 2022-12-17 | Discharge: 2022-12-17 | Disposition: A | Discharge status: Home / Self Care | Payer: Self-pay | Attending: Emergency Medicine | Admitting: Emergency Medicine

## 2022-12-17 ENCOUNTER — Encounter (HOSPITAL_BASED_OUTPATIENT_CLINIC_OR_DEPARTMENT_OTHER): Payer: Self-pay | Admitting: Emergency Medicine

## 2022-12-17 ENCOUNTER — Emergency Department (HOSPITAL_BASED_OUTPATIENT_CLINIC_OR_DEPARTMENT_OTHER): Payer: Self-pay

## 2022-12-17 DIAGNOSIS — R300 Dysuria: Secondary | ICD-10-CM | POA: Insufficient documentation

## 2022-12-17 DIAGNOSIS — R1032 Left lower quadrant pain: Secondary | ICD-10-CM

## 2022-12-17 DIAGNOSIS — R1909 Other intra-abdominal and pelvic swelling, mass and lump: Secondary | ICD-10-CM | POA: Insufficient documentation

## 2022-12-17 LAB — URINALYSIS, ROUTINE W REFLEX MICROSCOPIC
Bilirubin Urine: NEGATIVE
Glucose, UA: NEGATIVE mg/dL
Hgb urine dipstick: NEGATIVE
Ketones, ur: NEGATIVE mg/dL
Leukocytes,Ua: NEGATIVE
Nitrite: NEGATIVE
Protein, ur: NEGATIVE mg/dL
Specific Gravity, Urine: 1.025 (ref 1.005–1.030)
pH: 6.5 (ref 5.0–8.0)

## 2022-12-17 MED ORDER — CEFTRIAXONE SODIUM 500 MG IJ SOLR
500.0000 mg | Freq: Once | INTRAMUSCULAR | Status: AC
  Administered 2022-12-17: 500 mg via INTRAMUSCULAR
  Filled 2022-12-17: qty 500

## 2022-12-17 MED ORDER — LIDOCAINE-EPINEPHRINE (PF) 2 %-1:200000 IJ SOLN
10.0000 mL | Freq: Once | INTRAMUSCULAR | Status: AC
  Administered 2022-12-17: 10 mL
  Filled 2022-12-17: qty 20

## 2022-12-17 MED ORDER — DOXYCYCLINE HYCLATE 100 MG PO CAPS: 100.0000 mg | ORAL_CAPSULE | Freq: Two times a day (BID) | ORAL | 0 refills | Status: DC

## 2022-12-17 NOTE — ED Provider Notes (Signed)
[name] Provider Note   CSN: [id number] Arrival date & time: 12/17/22  1538     History  Chief Complaint  Patient presents with   Abscess    Shawn Dillon is a 27 y.o. male past medical history of eczema presents today for evaluation of abscess.  Patient states he noticed a small abscess in his left groin a month ago that drained pus.  Since then the abscess has grown bigger and the opening has closed.  States he has had some burning sensation with urination.  States he had GC chlamydia before with similar symptoms.  Patient is sexually active.  Last sexual intercourse was a month ago.  Denies any fever, nausea, vomiting, penile discharge, genital rash.   Abscess     Past Medical History:  Diagnosis Date   Eczema    Environmental and seasonal allergies    Fracture, metacarpal    RIGHT 4TH AND 5TH FINGER    Wears contact lenses    Past Surgical History:  Procedure Laterality Date   CLOSED REDUCTION FINGER WITH PERCUTANEOUS PINNING Right 01/29/2015   Procedure: RIGHT SMALL FINGER AND RIGHT RING FINGER CLOSED REDUCTION PINNING;  Surgeon: Iran Planas, MD;  Location: [facility];  Service: Orthopedics;  Laterality: Right;   NO PAST SURGERIES       Home Medications Prior to Admission medications   Medication Sig Start Date End Date Taking? Authorizing Provider  Albuterol Sulfate (PROAIR RESPICLICK) [id number] (90 Base) MCG/ACT AEPB Inhale 1-2 puffs into the lungs every 6 (six) hours as needed. 05/15/18   Mortis, Alvie Heidelberg I, PA-C  benzonatate (TESSALON) 100 MG capsule Take 1 capsule (100 mg total) by mouth every 8 (eight) hours. 04/01/21   Sherrill Raring, PA-C  Cetirizine HCl (ZYRTEC ALLERGY) 10 MG CAPS Take 1 capsule (10 mg total) by mouth daily. 01/25/18   Ward, Delice Bison, DO  doxycycline (VIBRAMYCIN) 100 MG capsule Take 1 capsule (100 mg total) by mouth 2 (two) times daily. 04/01/21   Sherrill Raring, PA-C  fluticasone (FLONASE) 50  MCG/ACT nasal spray Place 2 sprays into both nostrils daily. 01/25/18   Ward, Delice Bison, DO  ibuprofen (ADVIL,MOTRIN) 800 MG tablet Take 1 tablet (800 mg total) by mouth 3 (three) times daily. 10/12/16   Fransico Meadow, PA-C      Allergies    Vicodin [hydrocodone-acetaminophen]    Review of Systems   Review of Systems Negative except as per HPI.  Physical Exam Updated Vital Signs BP 137/68 (BP Location: Right Arm)   Pulse 73   Temp 97.6 F (36.4 C) (Oral)   Resp 18   Ht 5\' 9"  (1.753 m)   Wt 56.7 kg   SpO2 100%   BMI 18.46 kg/m  Physical Exam Vitals and nursing note reviewed.  Constitutional:      Appearance: Normal appearance.  HENT:     Head: Normocephalic and atraumatic.     Mouth/Throat:     Mouth: Mucous membranes are moist.  Eyes:     General: No scleral icterus. Cardiovascular:     Rate and Rhythm: Normal rate and regular rhythm.     Pulses: Normal pulses.     Heart sounds: Normal heart sounds.  Pulmonary:     Effort: Pulmonary effort is normal.     Breath sounds: Normal breath sounds.  Abdominal:     General: Abdomen is flat.     Palpations: Abdomen is soft.     Tenderness:  There is no abdominal tenderness.  Genitourinary:    Comments: GU exam with an EMT present throughout.  1 x 2 cm palpable mass in the left groin.  No blood or drainage noted. Musculoskeletal:        General: No deformity.  Skin:    General: Skin is warm.     Findings: No rash.  Neurological:     General: No focal deficit present.     Mental Status: He is alert.  Psychiatric:        Mood and Affect: Mood normal.     ED Results / Procedures / Treatments   Labs (all labs ordered are listed, but only abnormal results are displayed) Labs Reviewed - No data to display  EKG None  Radiology No results found.  Procedures Procedures    Medications Ordered in ED Medications  lidocaine-EPINEPHrine (XYLOCAINE W/EPI) 2 %-1:200000 (PF) injection 10 mL (10 mLs Infiltration Given by  Other 12/17/22 1801)    ED Course/ Medical Decision Making/ A&P                             Medical Decision Making Amount and/or Complexity of Data Reviewed Labs: ordered. Radiology: ordered.  Risk Prescription drug management.   This patient presents to the ED for L groin pain, this involves an extensive number of treatment options, and is a complaint that carries with a high risk of complications and morbidity.  The differential diagnosis includes lymphadenopathy, GC chlamydia, UTI, orchitis, epididymitis, infectious etiology.  This is not an exhaustive list.  Lab tests: I ordered and personally interpreted labs.  The pertinent results include: UA negative.  GC chlamydia pending.  Imaging studies: I ordered imaging studies. I personally reviewed, interpreted imaging and agree with the radiologist's interpretations. The results include: Ultrasound show mildly prominent lymph node in the left groin.  Problem list/ ED course/ Critical interventions/ Medical management: HPI: See above Vital signs within normal range and stable throughout visit. Laboratory/imaging studies significant for: See above. On physical examination, patient is afebrile and appears in no acute distress. This patient presents with dysuria and a history consistent with possible STI.  Patient present with left groin pain.  Ultrasound showed mildly prominent lymph nodes in the left groin.  Differential includes simple cystitis, pyelonephritis, epididymitis. Based on history and physical no signs of epididymitis or orchitis, or pyelonephritis at this time.  Patient's symptoms of dysuria is consistent with the symptoms he experienced when he had GC/chlamydia.  Will send UA and empirically treat for gonorrhea/chlamydia with IM CTX and PO doxycycline. I have reviewed the patient home medicines and have made adjustments as needed.  Cardiac monitoring/EKG: The patient was maintained on a cardiac monitor.  I personally  reviewed and interpreted the cardiac monitor which showed an underlying rhythm of: sinus rhythm.  Additional history obtained: External records from outside source obtained and reviewed including: Chart review including previous notes, labs, imaging.  Consultations obtained:  Disposition Continued outpatient therapy. Follow-up with PCP recommended for reevaluation of symptoms. Treatment plan discussed with patient.  Pt acknowledged understanding was agreeable to the plan. Worrisome signs and symptoms were discussed with patient, and patient acknowledged understanding to return to the ED if they noticed these signs and symptoms. Patient was stable upon discharge.   This chart was dictated using voice recognition software.  Despite best efforts to proofread,  errors can occur which can change the documentation meaning.  Final Clinical Impression(s) / ED Diagnoses Final diagnoses:  Groin pain, left  Dysuria    Rx / DC Orders ED Discharge Orders          Ordered    doxycycline (VIBRAMYCIN) 100 MG capsule  2 times daily        12/17/22 1953              Rex Kras, [place] 12/17/22 2005    Tegeler, Gwenyth Allegra, MD 12/17/22 [phone]

## 2022-12-17 NOTE — ED Triage Notes (Signed)
Pt sts he has a "bump" on LT side of pelvic area; sts looked like a "hair bump" at first; noticed 1 mo ago

## 2022-12-17 NOTE — ED Notes (Signed)
ED Provider at bedside. 

## 2022-12-17 NOTE — ED Notes (Signed)
Pt transported to [facility]

## 2022-12-17 NOTE — Discharge Instructions (Signed)
Please check your MyChart tomorrow for GC/chlamydia results. Take your doxycycline as prescribed if your chlamydia test came back positive.. Take tylenol/ibuprofen for pain. I recommend close follow-up with PCP for reevaluation.  Please do not hesitate to return to emergency department if worrisome signs symptoms we discussed become apparent.

## 2022-12-19 LAB — GC/CHLAMYDIA PROBE AMP (~~LOC~~) NOT AT ARMC
Chlamydia: POSITIVE — AB
Comment: NEGATIVE
Comment: NORMAL
Neisseria Gonorrhea: NEGATIVE

## 2022-12-24 IMAGING — DX DG CHEST 1V PORT
1 series · 1 of 1 positions shown · non-contrast
Comparison: 05/15/2018

CLINICAL DATA: Cough, headache and congestion.

EXAM:
PORTABLE CHEST 1 VIEW

[chest ap]
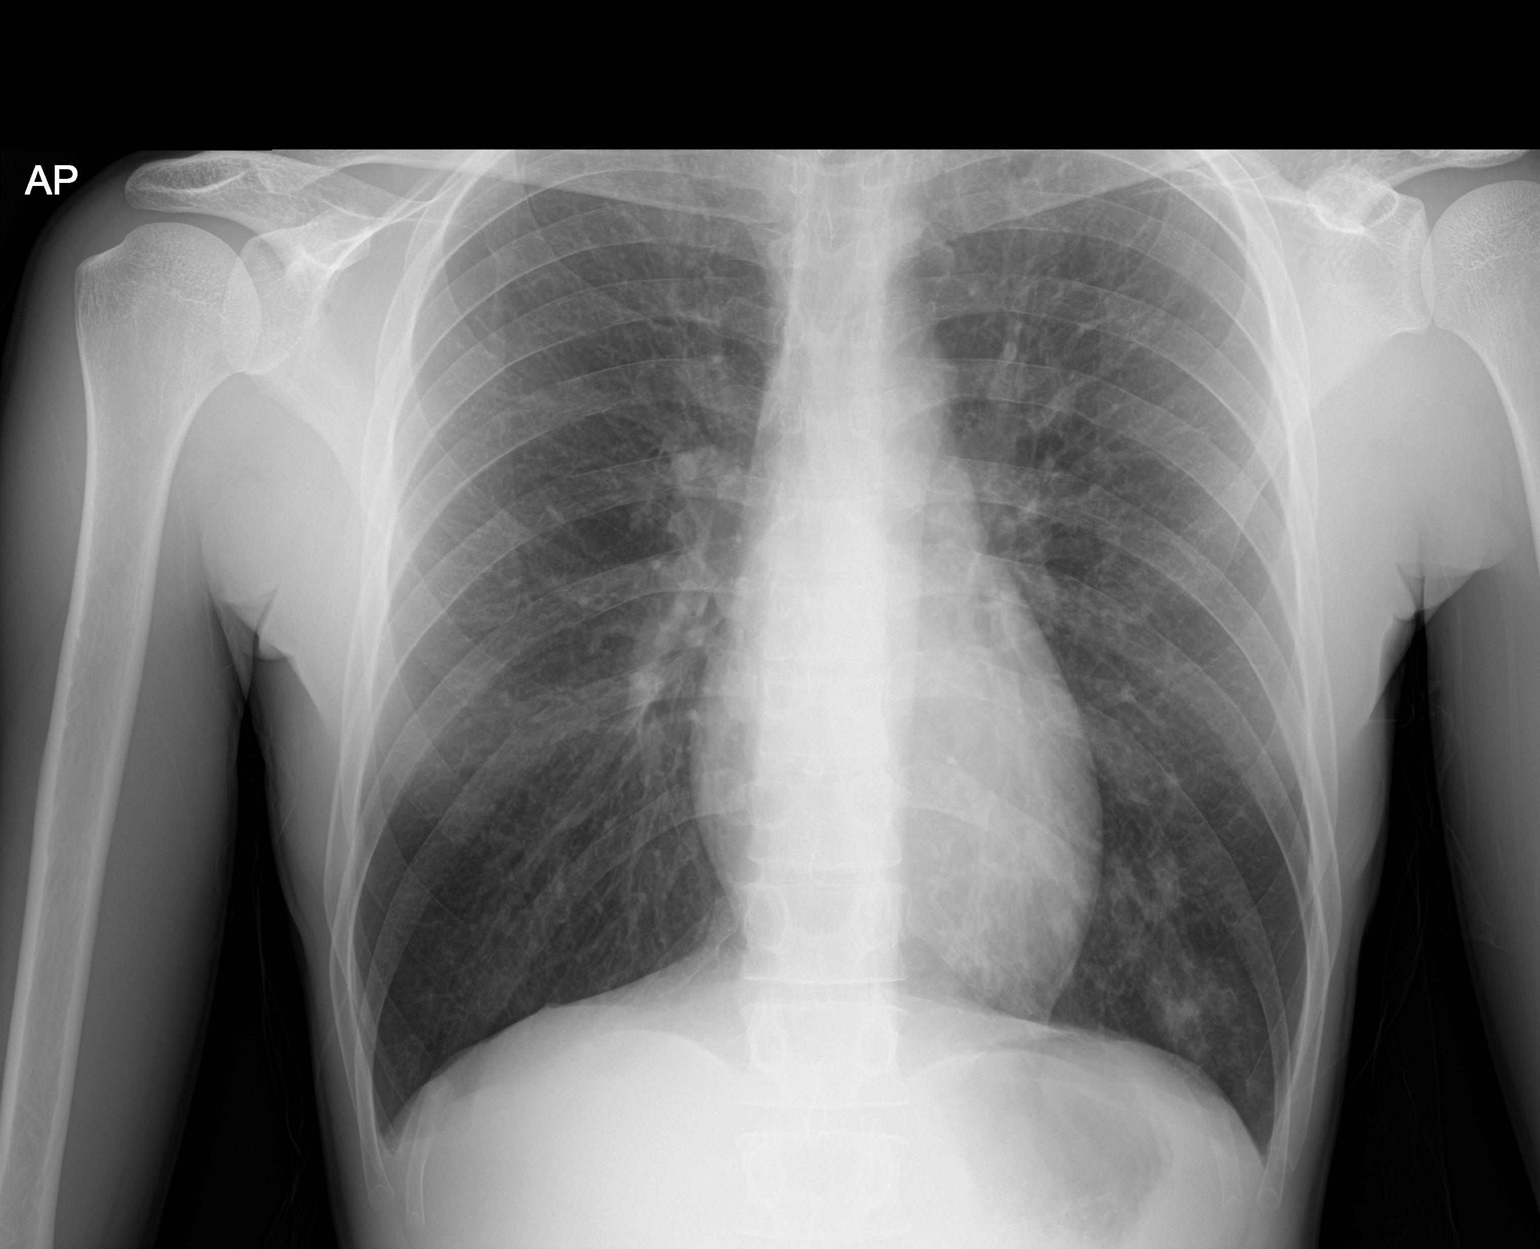

[1 of 1 positions shown; findings below may reference images not displayed]

FINDINGS: The cardiac silhouette, mediastinal and hilar contours are within
normal limits.

Patchy airspace opacity in the left lower lobe suggesting an
infiltrate/pneumonia. No pleural effusions or pulmonary lesions. The
bony thorax is intact.
IMPRESSION: Left lower lobe infiltrate/pneumonia.

## 2023-04-22 ENCOUNTER — Encounter (HOSPITAL_BASED_OUTPATIENT_CLINIC_OR_DEPARTMENT_OTHER): Payer: Self-pay

## 2023-04-22 ENCOUNTER — Other Ambulatory Visit: Payer: Self-pay

## 2023-04-22 ENCOUNTER — Emergency Department (HOSPITAL_BASED_OUTPATIENT_CLINIC_OR_DEPARTMENT_OTHER)
Admission: EM | Admit: 2023-04-22 | Discharge: 2023-04-22 | Disposition: A | Discharge status: Home / Self Care | Payer: Self-pay | Attending: Emergency Medicine | Admitting: Emergency Medicine

## 2023-04-22 DIAGNOSIS — R590 Localized enlarged lymph nodes: Secondary | ICD-10-CM | POA: Insufficient documentation

## 2023-04-22 DIAGNOSIS — R591 Generalized enlarged lymph nodes: Secondary | ICD-10-CM

## 2023-04-22 NOTE — ED Triage Notes (Signed)
Pt reports back pain/swelling x1 year. No injury reported. Pt also reports a "swollen lymph node" in his groin.

## 2023-04-22 NOTE — ED Provider Notes (Signed)
  [place] [facility] Provider Note   CSN: [id number] Arrival date & time: 04/22/23  0021     History  Chief Complaint  Patient presents with   Back Pain    Shawn Dillon is a 27 y.o. male.  The history is provided by the patient.   Patient presents with multiple complaints.  Patient reports he has had upper back pain and swelling for over a year.  No new injury.  No weakness is reported Patient also reports a swollen lymph node in his groin.  Is mildly tender to touch.  No penile discharge.  Last time he had this he had an STD.  Last sexual intercourse was around a month ago.    Home Medications Prior to Admission medications   Not on File      Allergies    Vicodin [hydrocodone-acetaminophen]    Review of Systems   Review of Systems  Constitutional:  Negative for fever.  Genitourinary:  Negative for dysuria and penile discharge.  Musculoskeletal:  Positive for back pain.  Neurological:  Negative for weakness.    Physical Exam Updated Vital Signs BP (!) 141/88 (BP Location: Left Arm)   Pulse (!) 54   Temp 98.2 F (36.8 C) (Oral)   Resp 12   Ht 1.727 m (5\' 8" )   Wt 56.7 kg   SpO2 100%   BMI 19.01 kg/m  Physical Exam CONSTITUTIONAL: Well developed/well nourished HEAD: Normocephalic/atraumatic EYES: EOMI/PERRL ENMT: Mucous membranes moist NECK: supple no meningeal signs SPINE/BACK:entire spine nontender, No bruising/crepitance/stepoffs noted to spine No bruising, no lesions, no nodules are noted.  No significant tenderness is noted Musculature is prominent but is symmetric bilaterally GU: Testicles are descended bilaterally.  No tenderness.  No erythema.  Small lymph node is palpated in the left groin.  No abscess or fluctuance.  Patient is circumcised with no penile lesions or discharge. NEURO: Pt is awake/alert/appropriate, moves all extremitiesx4.  No facial droop.     ED Results / Procedures / Treatments   Labs (all  labs ordered are listed, but only abnormal results are displayed) Labs Reviewed  GC/CHLAMYDIA PROBE AMP () NOT AT [facility]    EKG None  Radiology No results found.  Procedures Procedures    Medications Ordered in ED Medications - No data to display  ED Course/ Medical Decision Making/ A&P                                 Medical Decision Making  Patient presents with multiple nonemergent complaints.  Reports back pain and swelling for a year.  No evidence of any fluctuance, no abscess or bruising.  Patient has prominent musculature but it is symmetric bilaterally.  Advise follow-up with PCP  Patient also reports enlarged lymph node in his groin.  There is a mildly prominent lymph node but no other significant findings Patient request, will send GC chlamydia testing.  Patient advised to follow-up with health department next time for any testing       Final Clinical Impression(s) / ED Diagnoses Final diagnoses:  Lymphadenopathy    Rx / DC Orders ED Discharge Orders     None         Zadie Rhine, MD 04/22/23 [phone]

## 2023-04-24 LAB — GC/CHLAMYDIA PROBE AMP (~~LOC~~) NOT AT ARMC
Chlamydia: NEGATIVE
Comment: NEGATIVE
Comment: NORMAL
Neisseria Gonorrhea: NEGATIVE

## 2023-05-26 ENCOUNTER — Other Ambulatory Visit (HOSPITAL_COMMUNITY): Payer: Self-pay

## 2024-07-14 ENCOUNTER — Emergency Department (HOSPITAL_BASED_OUTPATIENT_CLINIC_OR_DEPARTMENT_OTHER)
Admission: EM | Admit: 2024-07-14 | Discharge: 2024-07-14 | Disposition: A | Discharge status: Home / Self Care | Payer: Self-pay | Attending: Emergency Medicine | Admitting: Emergency Medicine

## 2024-07-14 ENCOUNTER — Other Ambulatory Visit: Payer: Self-pay

## 2024-07-14 DIAGNOSIS — L02214 Cutaneous abscess of groin: Secondary | ICD-10-CM | POA: Insufficient documentation

## 2024-07-14 MED ORDER — DOXYCYCLINE HYCLATE 100 MG PO CAPS: 100.0000 mg | ORAL_CAPSULE | Freq: Two times a day (BID) | ORAL | 0 refills | Status: AC

## 2024-07-14 NOTE — ED Notes (Signed)
 MD in room during triage and assessed pt with RN at bedside.

## 2024-07-14 NOTE — ED Triage Notes (Signed)
 Pt from home via POV.  Pt reports lymph node in my scrotum is swollen.  Pt states issue started x2 days ago.  Pt states pain is 5/10.  Pt states similar issue last year.

## 2024-07-14 NOTE — ED Provider Notes (Signed)
 [place] [facility] Provider Note  CSN: [id number] Arrival date & time: 07/14/24 0341  Chief Complaint(s) Groin Swelling  HPI Shawn Dillon is a 28 y.o. male with recurrent left groin swelling.  Patient reports he was told this was a lymph node previously.  He was tested for STD and negative.  Reports that there is did not go away but returned 2 days ago.  He denies any trauma.  Denies any penile discharge.  No other physical  HPI  Past Medical History Past Medical History:  Diagnosis Date   Eczema    Environmental and seasonal allergies    Fracture, metacarpal    RIGHT 4TH AND 5TH FINGER    Wears contact lenses    Patient Active Problem List   Diagnosis Date Noted   Well child visit 11/13/2012   Seasonal allergies 06/24/2011   Asthma 06/24/2011   Eczema 06/24/2011   Home Medication(s) Prior to Admission medications   Medication Sig Start Date End Date Taking? Authorizing Provider  doxycycline  (VIBRAMYCIN ) 100 MG capsule Take 1 capsule (100 mg total) by mouth 2 (two) times daily. 07/14/24  Yes Arch Methot, Raynell Moder, MD                                                                                                                                    Allergies Vicodin [hydrocodone -acetaminophen ]  Review of Systems Review of Systems As noted in HPI  Physical Exam Vital Signs  I have reviewed the triage vital signs BP 125/87   Pulse 70   Temp 97.7 F (36.5 C) (Oral)   Resp 16   Ht 5' 9 (1.753 m)   Wt 63.5 kg   SpO2 100%   BMI 20.67 kg/m   Physical Exam Vitals reviewed. Exam conducted with a chaperone present.  Constitutional:      General: He is not in acute distress.    Appearance: He is well-developed. He is not diaphoretic.  HENT:     Head: Normocephalic and atraumatic.     Right Ear: External ear normal.     Left Ear: External ear normal.     Nose: Nose normal.     Mouth/Throat:     Mouth: Mucous membranes are  moist.  Eyes:     General: No scleral icterus.    Conjunctiva/sclera: Conjunctivae normal.  Neck:     Trachea: Phonation normal.  Cardiovascular:     Rate and Rhythm: Normal rate and regular rhythm.  Pulmonary:     Effort: Pulmonary effort is normal. No respiratory distress.     Breath sounds: No stridor.  Abdominal:     General: There is no distension.  Genitourinary:  Musculoskeletal:        General: Normal range of motion.     Cervical back: Normal range of motion.  Neurological:     Mental Status: He is alert and oriented to person,  place, and time.  Psychiatric:        Behavior: Behavior normal.     ED Results and Treatments Labs (all labs ordered are listed, but only abnormal results are displayed) Labs Reviewed - No data to display                                                                                                                       EKG  EKG Interpretation Date/Time:    Ventricular Rate:    PR Interval:    QRS Duration:    QT Interval:    QTC Calculation:   R Axis:      Text Interpretation:         Radiology No results found.  Medications Ordered in ED Medications - No data to display Procedures Procedures  (including critical care time) Medical Decision Making / ED Course   Medical Decision Making   Exam is consistent with left knee growing abscess.  Easily draining.  No overlying cellulitis.  Do not feel that I&D is necessary at this time.  Recommended warm compress or sitz bath's.  Will prescribe Doxy.    Final Clinical Impression(s) / ED Diagnoses Final diagnoses:  Abscess of groin, left   The patient appears reasonably screened and/or stabilized for discharge and I doubt any other medical condition or other [facility] requiring further screening, evaluation, or treatment in the ED at this time. I have discussed the findings, Dx and Tx plan with the patient/family who expressed understanding and agree(s) with the plan. Discharge  instructions discussed at length. The patient/family was given strict return precautions who verbalized understanding of the instructions. No further questions at time of discharge.  Disposition: Discharge  Condition: Good  ED Discharge Orders          Ordered    doxycycline  (VIBRAMYCIN ) 100 MG capsule  2 times daily        07/14/24 0400             This chart was dictated using voice recognition software.  Despite best efforts to proofread,  errors can occur which can change the documentation meaning.    Trine Raynell Moder, MD 07/14/24 [phone]

## 2024-07-15 ENCOUNTER — Telehealth (HOSPITAL_BASED_OUTPATIENT_CLINIC_OR_DEPARTMENT_OTHER): Payer: Self-pay

## 2024-07-15 NOTE — Telephone Encounter (Signed)
 Pt called and was asking where he should get his prescription. Upon looking at patients chart, pt was given a paper prescription and was informed that he could take his paper script anywhere to be filled.

## 2024-09-10 ENCOUNTER — Other Ambulatory Visit: Payer: Self-pay

## 2024-09-10 ENCOUNTER — Emergency Department (HOSPITAL_BASED_OUTPATIENT_CLINIC_OR_DEPARTMENT_OTHER): Payer: Self-pay

## 2024-09-10 ENCOUNTER — Emergency Department (HOSPITAL_BASED_OUTPATIENT_CLINIC_OR_DEPARTMENT_OTHER)
Admission: EM | Admit: 2024-09-10 | Discharge: 2024-09-11 | Disposition: A | Discharge status: Home / Self Care | Payer: Self-pay | Attending: Emergency Medicine | Admitting: Emergency Medicine

## 2024-09-10 ENCOUNTER — Encounter (HOSPITAL_BASED_OUTPATIENT_CLINIC_OR_DEPARTMENT_OTHER): Payer: Self-pay

## 2024-09-10 DIAGNOSIS — Y9351 Activity, roller skating (inline) and skateboarding: Secondary | ICD-10-CM | POA: Insufficient documentation

## 2024-09-10 DIAGNOSIS — S93402A Sprain of unspecified ligament of left ankle, initial encounter: Secondary | ICD-10-CM | POA: Insufficient documentation

## 2024-09-10 MED ORDER — NAPROXEN 250 MG PO TABS
500.0000 mg | ORAL_TABLET | Freq: Once | ORAL | Status: AC
  Administered 2024-09-10: 500 mg via ORAL
  Filled 2024-09-10: qty 2

## 2024-09-10 NOTE — ED Provider Notes (Incomplete)
 " [place] [facility] Provider Note   CSN: [id number] Arrival date & time: 09/10/24  1927     Patient presents with: Ankle Pain   Shawn Dillon is a 28 y.o. male.       Prior to Admission medications  Medication Sig Start Date End Date Taking? Authorizing Provider  doxycycline  (VIBRAMYCIN ) 100 MG capsule Take 1 capsule (100 mg total) by mouth 2 (two) times daily. 07/14/24   Cardama, Raynell Moder, MD    Allergies: Vicodin [hydrocodone -acetaminophen ]    Review of Systems  Musculoskeletal:  Positive for arthralgias and joint swelling.  All other systems reviewed and are negative.   Updated Vital Signs BP (!) 169/81 (BP Location: Right Arm)   Pulse (!) 109   Temp 98.2 F (36.8 C) (Oral)   Resp 18   Ht 5' 8 (1.727 m)   Wt 65.3 kg   SpO2 98%   BMI 21.90 kg/m   Physical Exam Vitals and nursing note reviewed.  Constitutional:      Appearance: Normal appearance.  HENT:     Head: Normocephalic and atraumatic.     Nose: Nose normal.  Eyes:     Extraocular Movements: Extraocular movements intact.     Conjunctiva/sclera: Conjunctivae normal.     Pupils: Pupils are equal, round, and reactive to light.  Cardiovascular:     Rate and Rhythm: Normal rate.  Pulmonary:     Effort: Pulmonary effort is normal. No respiratory distress.     Breath sounds: Normal breath sounds.  Abdominal:     General: Abdomen is flat.     Tenderness: There is no abdominal tenderness. There is no guarding.  Musculoskeletal:        General: Swelling present. Normal range of motion.     Cervical back: Normal range of motion.     Comments: Swelling noted in the left ankle.  Patient does have range of motion throughout the ankle with pain.  Patient has full sensation throughout ankle and foot.  Patient was able to move toes and has good cap refill.  Pulses present.  Full range of motion of knee and no pain to palpation throughout proximal tib-fib or knee.   Skin:    General: Skin is warm.     Capillary Refill: Capillary refill takes less than 2 seconds.     Findings: No bruising.  Neurological:     General: No focal deficit present.     Mental Status: He is alert.  Psychiatric:        Mood and Affect: Mood normal.        Behavior: Behavior normal.     (all labs ordered are listed, but only abnormal results are displayed) Labs Reviewed - No data to display  EKG: None  Radiology: DG Foot Complete Left Result Date: 09/10/2024 CLINICAL DATA:  Foot and ankle pain after injury.  Swelling. EXAM: LEFT ANKLE COMPLETE - 3+ VIEW; LEFT FOOT - COMPLETE 3+ VIEW COMPARISON:  None Available. FINDINGS: Ankle: No acute fracture or dislocation. The alignment and ankle mortise are preserved. No ankle joint effusion. Mild soft tissue edema. Foot: No acute fracture or dislocation. Alignment and joint spaces are preserved. Mild soft tissue edema over the dorsum of the foot. IMPRESSION: Soft tissue edema. No acute fracture or dislocation of the left ankle or foot. Electronically Signed   By: Andrea Gasman M.D.   On: 09/10/2024 20:41   DG Ankle Complete Left Result Date: 09/10/2024 CLINICAL DATA:  Foot and ankle pain after injury.  Swelling. EXAM: LEFT ANKLE COMPLETE - 3+ VIEW; LEFT FOOT - COMPLETE 3+ VIEW COMPARISON:  None Available. FINDINGS: Ankle: No acute fracture or dislocation. The alignment and ankle mortise are preserved. No ankle joint effusion. Mild soft tissue edema. Foot: No acute fracture or dislocation. Alignment and joint spaces are preserved. Mild soft tissue edema over the dorsum of the foot. IMPRESSION: Soft tissue edema. No acute fracture or dislocation of the left ankle or foot. Electronically Signed   By: Andrea Gasman M.D.   On: 09/10/2024 20:41    {Document cardiac monitor, telemetry assessment procedure when appropriate:32947} Procedures   Medications Ordered in the ED - No data to display  28 y.o. male presents to the ED with  complaints of ***, this involves an extensive number of treatment options, and is a complaint that carries with it a high risk of complications and morbidity.  The differential diagnosis includes *** (Ddx)  On arrival pt is nontoxic, vitals ***. Exam significant for ***  Additional history obtained from ***. Previous records obtained and reviewed ***  I ordered medication *** for ***  Lab Tests:  I Ordered, reviewed, and interpreted labs, which included:   Imaging Studies ordered:  I ordered imaging studies which included ***, I independently visualized and interpreted imaging which showed ***  ED Course:   Patient was requesting crutches after application of ankle brace in case he needs them.  Portions of this note were generated with Scientist, clinical (histocompatibility and immunogenetics). Dictation errors may occur despite best attempts at proofreading.   Final diagnoses:  None    ED Discharge Orders     None        "

## 2024-09-10 NOTE — ED Triage Notes (Signed)
 Pt reports he twisted his left ankle tonight when skateboarding. Mild swelling noted to left lateral ankle and to the top of his foot. He reports he is unable to walk on it. + pedal pulse, + sensation, cap refill <3 sec.

## 2024-09-10 NOTE — ED Provider Notes (Signed)
 "  [facility] Provider Note   CSN: [id number] Arrival date & time: 09/10/24  1927     Patient presents with: Ankle Pain   Shawn Dillon is a 28 y.o. male.  28 year old male presents to the ED with complaints of left ankle pain following a skateboard accident.  He reports he was skateboarding this evening and he tried to do a trick when he came down on his left ankle.  He advised he landed on it wrong but did not fall to the ground.  Patient denies any other injury during this incident.  He advises he was able to walk on it after the incident but since then it has gotten more painful and is more difficult to bear weight on it.  Patient denies any significant medical history reports he does not take any daily medication.     Prior to Admission medications  Medication Sig Start Date End Date Taking? Authorizing Provider  naproxen  (NAPROSYN ) 500 MG tablet Take 1 tablet (500 mg total) by mouth 2 (two) times daily. 09/11/24  Yes Myriam Fonda RAMAN, PA-C  doxycycline  (VIBRAMYCIN ) 100 MG capsule Take 1 capsule (100 mg total) by mouth 2 (two) times daily. 07/14/24   Cardama, Raynell Moder, MD    Allergies: Vicodin [hydrocodone -acetaminophen ]    Review of Systems  Musculoskeletal:  Positive for arthralgias and joint swelling.  All other systems reviewed and are negative.   Updated Vital Signs BP (!) 154/80 (BP Location: Right Arm)   Pulse 98   Temp 98.2 F (36.8 C) (Oral)   Resp 20   Ht 5' 8 (1.727 m)   Wt 65.3 kg   SpO2 99%   BMI 21.90 kg/m   Physical Exam Vitals and nursing note reviewed.  Constitutional:      Appearance: Normal appearance.  HENT:     Head: Normocephalic and atraumatic.     Nose: Nose normal.  Eyes:     Extraocular Movements: Extraocular movements intact.     Conjunctiva/sclera: Conjunctivae normal.     Pupils: Pupils are equal, round, and reactive to light.  Cardiovascular:     Rate and Rhythm: Normal rate.   Pulmonary:     Effort: Pulmonary effort is normal. No respiratory distress.     Breath sounds: Normal breath sounds.  Abdominal:     General: Abdomen is flat.     Tenderness: There is no abdominal tenderness. There is no guarding.  Musculoskeletal:        General: Swelling present. Normal range of motion.     Cervical back: Normal range of motion.     Comments: Swelling noted in the left ankle.  Patient does have range of motion throughout the ankle with pain.  Patient has full sensation throughout ankle and foot.  Patient was able to move toes and has good cap refill.  Pulses present.  Full range of motion of knee and no pain to palpation throughout proximal tib-fib or knee.  Skin:    General: Skin is warm.     Capillary Refill: Capillary refill takes less than 2 seconds.     Findings: No bruising.  Neurological:     General: No focal deficit present.     Mental Status: He is alert.  Psychiatric:        Mood and Affect: Mood normal.        Behavior: Behavior normal.     (all labs ordered are listed, but only abnormal results are displayed)  Labs Reviewed - No data to display  EKG: None  Radiology: No results found.   Procedures   Medications Ordered in the ED  naproxen  (NAPROSYN ) tablet 500 mg (500 mg Oral Given 09/10/24 2318)    28 y.o. male presents to the ED with complaints of left ankle pain following a skateboarding accident.  The differential diagnosis includes but not limited to ankle sprain, dislocation, fracture, musculoskeletal injury, arterial/innervation injury (Ddx)  On arrival pt is nontoxic, vitals unremarkable. Exam significant for mild swelling in the left ankle  I ordered medication naproxen  for pain  Imaging Studies ordered:  Complete foot and ankle x-rays ordered in triage.  Negative for any acute fracture or dislocation, concerning for mild tissue edema.  ED Course:   Patient is sitting comfortably in ED triage room on initial evaluation.   Patient is able to bear weight on the leg.  No concerns for other injuries at this time.  Patient was given ankle brace and naproxen .  Patient was requesting crutches after application of ankle brace in case he needs them.  Patient was able to ambulate with crutches and without crutches prior to discharge.  Patient was advised of strict return precautions and was comfortable discharge at this time.  Portions of this note were generated with Scientist, clinical (histocompatibility and immunogenetics). Dictation errors may occur despite best attempts at proofreading.   Final diagnoses:  Sprain of left ankle, unspecified ligament, initial encounter    ED Discharge Orders          Ordered    naproxen  (NAPROSYN ) 500 MG tablet  2 times daily        09/11/24 0001               Myriam Fonda GORMAN DEVONNA 09/14/24 1936    Armenta Canning, MD 09/28/24 0945  "

## 2024-09-11 MED ORDER — NAPROXEN 500 MG PO TABS: 500.0000 mg | ORAL_TABLET | Freq: Two times a day (BID) | ORAL | 0 refills | Status: AC

## 2024-09-11 NOTE — Discharge Instructions (Addendum)
 X-rays are reassuring today. It is advised to take ibuprofen  and Tylenol  as needed for pain.  Please also elevate the foot and use ice as needed.  We have provided you with a ankle brace and crutches today, please use them as needed to help with ambulation.  It is advised to follow-up with orthopedics for further evaluation and recommendations.  If you experience any new or worsening symptoms please return to ED for further evaluation.
